# Patient Record
Sex: Male | Born: 1955 | Race: Black or African American | Hispanic: No | Marital: Married | Smoking: Former smoker
Health system: Southern US, Community
[De-identification: ages and names within clinical notes are randomized; demographics above are authoritative.]

## PROBLEM LIST (undated history)

## (undated) DIAGNOSIS — F1010 Alcohol abuse, uncomplicated: Secondary | ICD-10-CM

## (undated) DIAGNOSIS — E119 Type 2 diabetes mellitus without complications: Secondary | ICD-10-CM

## (undated) DIAGNOSIS — M109 Gout, unspecified: Secondary | ICD-10-CM

## (undated) DIAGNOSIS — Z87891 Personal history of nicotine dependence: Secondary | ICD-10-CM

## (undated) DIAGNOSIS — I10 Essential (primary) hypertension: Secondary | ICD-10-CM

## (undated) DIAGNOSIS — E785 Hyperlipidemia, unspecified: Secondary | ICD-10-CM

## (undated) HISTORY — PX: CHOLECYSTECTOMY: SHX55

## (undated) HISTORY — PX: HERNIA REPAIR: SHX51

## (undated) HISTORY — PX: BACK SURGERY: SHX140

---

## 2011-03-10 DIAGNOSIS — I10 Essential (primary) hypertension: Secondary | ICD-10-CM | POA: Diagnosis present

## 2011-03-10 DIAGNOSIS — M109 Gout, unspecified: Secondary | ICD-10-CM | POA: Diagnosis present

## 2012-10-12 DIAGNOSIS — E785 Hyperlipidemia, unspecified: Secondary | ICD-10-CM | POA: Insufficient documentation

## 2018-09-16 DIAGNOSIS — B182 Chronic viral hepatitis C: Secondary | ICD-10-CM | POA: Insufficient documentation

## 2019-09-06 ENCOUNTER — Encounter: Payer: Self-pay | Admitting: Emergency Medicine

## 2019-09-06 ENCOUNTER — Emergency Department
Admission: EM | Admit: 2019-09-06 | Discharge: 2019-09-06 | Disposition: A | Discharge status: Home / Self Care | Payer: 59 | Attending: Emergency Medicine | Admitting: Emergency Medicine

## 2019-09-06 ENCOUNTER — Other Ambulatory Visit: Payer: Self-pay

## 2019-09-06 DIAGNOSIS — M109 Gout, unspecified: Secondary | ICD-10-CM | POA: Insufficient documentation

## 2019-09-06 DIAGNOSIS — Z87891 Personal history of nicotine dependence: Secondary | ICD-10-CM | POA: Diagnosis not present

## 2019-09-06 DIAGNOSIS — E119 Type 2 diabetes mellitus without complications: Secondary | ICD-10-CM | POA: Diagnosis not present

## 2019-09-06 DIAGNOSIS — M79675 Pain in left toe(s): Secondary | ICD-10-CM | POA: Diagnosis present

## 2019-09-06 HISTORY — DX: Type 2 diabetes mellitus without complications: E11.9

## 2019-09-06 HISTORY — DX: Gout, unspecified: M10.9

## 2019-09-06 LAB — URIC ACID: Uric Acid, Serum: 6.8 mg/dL (ref 3.7–8.6)

## 2019-09-06 MED ORDER — COLCHICINE 0.6 MG PO TABS: 1.2000 mg | ORAL_TABLET | Freq: Once | ORAL | 0 refills | Status: DC

## 2019-09-06 MED ORDER — HYDROCODONE-ACETAMINOPHEN 5-325 MG PO TABS: 1.0000 | ORAL_TABLET | Freq: Four times a day (QID) | ORAL | 0 refills | Status: AC | PRN

## 2019-09-06 NOTE — ED Provider Notes (Signed)
[facility] Emergency Department Provider Note ____________________________________________  Time seen: Approximately 5:57 PM  I have reviewed the triage vital signs and the nursing notes.   HISTORY  Chief Complaint Gout    HPI James Mcmillan. is a 64 y.o. male who presents to the emergency department for evaluation and treatment of left great toe pain with redness and swelling that started 3 days ago.  He has a history of gout and feels that this is the cause of his pain.  He has taken Indocin without relief.  He denies injury.  He does state that he has some new tennis shoes and has been working out but denies injury.   Past Medical History:  Diagnosis Date  . Diabetes mellitus without complication ([place])   . Gout     There are no problems to display for this patient.   Past Surgical History:  Procedure Laterality Date  . BACK SURGERY    . CHOLECYSTECTOMY    . HERNIA REPAIR      Prior to Admission medications   Medication Sig Start Date End Date Taking? Authorizing Provider  colchicine 0.6 MG tablet Take 2 tablets (1.2 mg total) by mouth once for 1 dose. If pain persists after 1 hour, take 1 additional pill. No more than 3 pills in 24 hours. 09/06/19 09/06/19  Kendre Sires, Johnette Abraham B, FNP  HYDROcodone-acetaminophen (NORCO/VICODIN) 5-325 MG tablet Take 1 tablet by mouth every 6 (six) hours as needed for up to 3 days for severe pain. 09/06/19 09/09/19  Victorino Dike, FNP    Allergies Patient has no known allergies.  No family history on file.  Social History Social History   Tobacco Use  . Smoking status: Former Research scientist (life sciences)  . Smokeless tobacco: Never Used  Substance Use Topics  . Alcohol use: Yes  . Drug use: Never    Review of Systems Constitutional: Negative for fever. Cardiovascular: Negative for chest pain. Respiratory: Negative for shortness of breath. Musculoskeletal: Positive for left great toe pain. Skin: Positive for erythema and swelling to  the joint of the left great toe. Neurological: Negative for decrease in sensation  ____________________________________________   PHYSICAL EXAM:  VITAL SIGNS: ED Triage Vitals  Enc Vitals Group     BP 09/06/19 1643 (!) 145/97     Pulse Rate 09/06/19 1643 86     Resp 09/06/19 1643 16     Temp 09/06/19 1643 98.3 F (36.8 C)     Temp Source 09/06/19 1643 Oral     SpO2 09/06/19 1643 99 %     Weight 09/06/19 1643 204 lb (92.5 kg)     Height 09/06/19 1643 5\' 11"  (1.803 m)     Head Circumference --      Peak Flow --      Pain Score 09/06/19 1642 8     Pain Loc --      Pain Edu? --      Excl. in [place]? --     Constitutional: Alert and oriented. Well appearing and in no acute distress. Eyes: Conjunctivae are clear without discharge or drainage Head: Atraumatic Neck: Supple. Respiratory: No cough. Respirations are even and unlabored. Musculoskeletal: Focal pain, erythema, and swelling over the MTP of the left great toe. Neurologic: Motor and sensory function is intact Skin: Warm and erythematous over the MTP of the left great toe Psychiatric: Affect and behavior are appropriate.  ____________________________________________   LABS (all labs ordered are listed, but only abnormal results are displayed)  Labs Reviewed  URIC ACID   ____________________________________________  RADIOLOGY  No results found. ____________________________________________   PROCEDURES  Procedures  ____________________________________________   INITIAL IMPRESSION / ASSESSMENT AND PLAN / ED COURSE  James Mcmillan. is a 64 y.o. who presents to the emergency department for treatment and evaluation of left great toe pain.  See HPI for further details.  Symptoms and exam is consistent with gout flare.  He will be treated with colchicine and given few tablets of Norco.  He was advised not to drive if taking the Norco.  Patient instructed to follow-up with podiatry if not improving.  He was also  instructed to return to the emergency department for symptoms that change or worsen if unable schedule an appointment with podiatry or primary care.  Medications - No data to display  Pertinent labs & imaging results that were available during my care of the patient were reviewed by me and considered in my medical decision making (see chart for details).   _________________________________________   FINAL CLINICAL IMPRESSION(S) / ED DIAGNOSES  Final diagnoses:  Acute gout involving toe of left foot, unspecified cause    ED Discharge Orders         Ordered    colchicine 0.6 MG tablet   Once     09/06/19 1749    HYDROcodone-acetaminophen (NORCO/VICODIN) 5-325 MG tablet  Every 6 hours PRN     09/06/19 1749           If controlled substance prescribed during this visit, 12 month history viewed on the [facility] prior to issuing an initial prescription for Schedule II or III opiod.   Chinita Pester, FNP 09/06/19 1800    Phineas Semen, MD 09/06/19 2028

## 2019-09-06 NOTE — ED Triage Notes (Signed)
Pt in via POV, reports swelling/pain to left great toe since Sunday.  Pt reports hx of gout.  NAD noted at this time.

## 2019-09-06 NOTE — Discharge Instructions (Signed)
Do not take the Norco if you are on the road.  Follow up with podiatry for symptoms that are not improving with medication and rest.

## 2019-09-30 ENCOUNTER — Emergency Department
Admission: EM | Admit: 2019-09-30 | Discharge: 2019-09-30 | Disposition: A | Discharge status: Home / Self Care | Payer: 59 | Attending: Emergency Medicine | Admitting: Emergency Medicine

## 2019-09-30 ENCOUNTER — Emergency Department: Payer: 59

## 2019-09-30 ENCOUNTER — Encounter: Payer: Self-pay | Admitting: Emergency Medicine

## 2019-09-30 ENCOUNTER — Other Ambulatory Visit: Payer: Self-pay

## 2019-09-30 DIAGNOSIS — E119 Type 2 diabetes mellitus without complications: Secondary | ICD-10-CM | POA: Insufficient documentation

## 2019-09-30 DIAGNOSIS — M10072 Idiopathic gout, left ankle and foot: Secondary | ICD-10-CM | POA: Insufficient documentation

## 2019-09-30 DIAGNOSIS — Z87891 Personal history of nicotine dependence: Secondary | ICD-10-CM | POA: Insufficient documentation

## 2019-09-30 DIAGNOSIS — M8580 Other specified disorders of bone density and structure, unspecified site: Secondary | ICD-10-CM

## 2019-09-30 DIAGNOSIS — M85872 Other specified disorders of bone density and structure, left ankle and foot: Secondary | ICD-10-CM | POA: Insufficient documentation

## 2019-09-30 DIAGNOSIS — M1000 Idiopathic gout, unspecified site: Secondary | ICD-10-CM

## 2019-09-30 DIAGNOSIS — M79672 Pain in left foot: Secondary | ICD-10-CM | POA: Diagnosis present

## 2019-09-30 LAB — CBC WITH DIFFERENTIAL/PLATELET
Abs Immature Granulocytes: 0.01 10*3/uL (ref 0.00–0.07)
Basophils Absolute: 0 10*3/uL (ref 0.0–0.1)
Basophils Relative: 0 %
Eosinophils Absolute: 0.5 10*3/uL (ref 0.0–0.5)
Eosinophils Relative: 7 %
HCT: 43 % (ref 39.0–52.0)
Hemoglobin: 14.1 g/dL (ref 13.0–17.0)
Immature Granulocytes: 0 %
Lymphocytes Relative: 27 %
Lymphs Abs: 2 10*3/uL (ref 0.7–4.0)
MCH: 29.4 pg (ref 26.0–34.0)
MCHC: 32.8 g/dL (ref 30.0–36.0)
MCV: 89.6 fL (ref 80.0–100.0)
Monocytes Absolute: 0.8 10*3/uL (ref 0.1–1.0)
Monocytes Relative: 11 %
Neutro Abs: 4.1 10*3/uL (ref 1.7–7.7)
Neutrophils Relative %: 55 %
Platelets: 231 10*3/uL (ref 150–400)
RBC: 4.8 MIL/uL (ref 4.22–5.81)
RDW: 11.7 % (ref 11.5–15.5)
WBC: 7.4 10*3/uL (ref 4.0–10.5)
nRBC: 0 % (ref 0.0–0.2)

## 2019-09-30 LAB — COMPREHENSIVE METABOLIC PANEL
ALT: 24 U/L (ref 0–44)
AST: 27 U/L (ref 15–41)
Albumin: 4.1 g/dL (ref 3.5–5.0)
Alkaline Phosphatase: 55 U/L (ref 38–126)
Anion gap: 7 (ref 5–15)
BUN: 12 mg/dL (ref 8–23)
CO2: 27 mmol/L (ref 22–32)
Calcium: 9.8 mg/dL (ref 8.9–10.3)
Chloride: 106 mmol/L (ref 98–111)
Creatinine, Ser: 1.02 mg/dL (ref 0.61–1.24)
GFR calc Af Amer: >60 mL/min (ref >60)
GFR calc non Af Amer: >60 mL/min (ref >60)
Glucose, Bld: 134 mg/dL — ABNORMAL HIGH (ref 70–99)
Potassium: 5.2 mmol/L — ABNORMAL HIGH (ref 3.5–5.1)
Sodium: 140 mmol/L (ref 135–145)
Total Bilirubin: 0.7 mg/dL (ref 0.3–1.2)
Total Protein: 7.6 g/dL (ref 6.5–8.1)

## 2019-09-30 LAB — URIC ACID: Uric Acid, Serum: 6.2 mg/dL (ref 3.7–8.6)

## 2019-09-30 IMAGING — DX DG FOOT COMPLETE 3+V*L*
3 series · 3 of 3 positions shown · non-contrast
Comparison: None.

CLINICAL DATA: Pain and swelling in the left foot. History of gout.
No reported injury.

EXAM:
LEFT FOOT - COMPLETE 3+ VIEW

[foot ap]
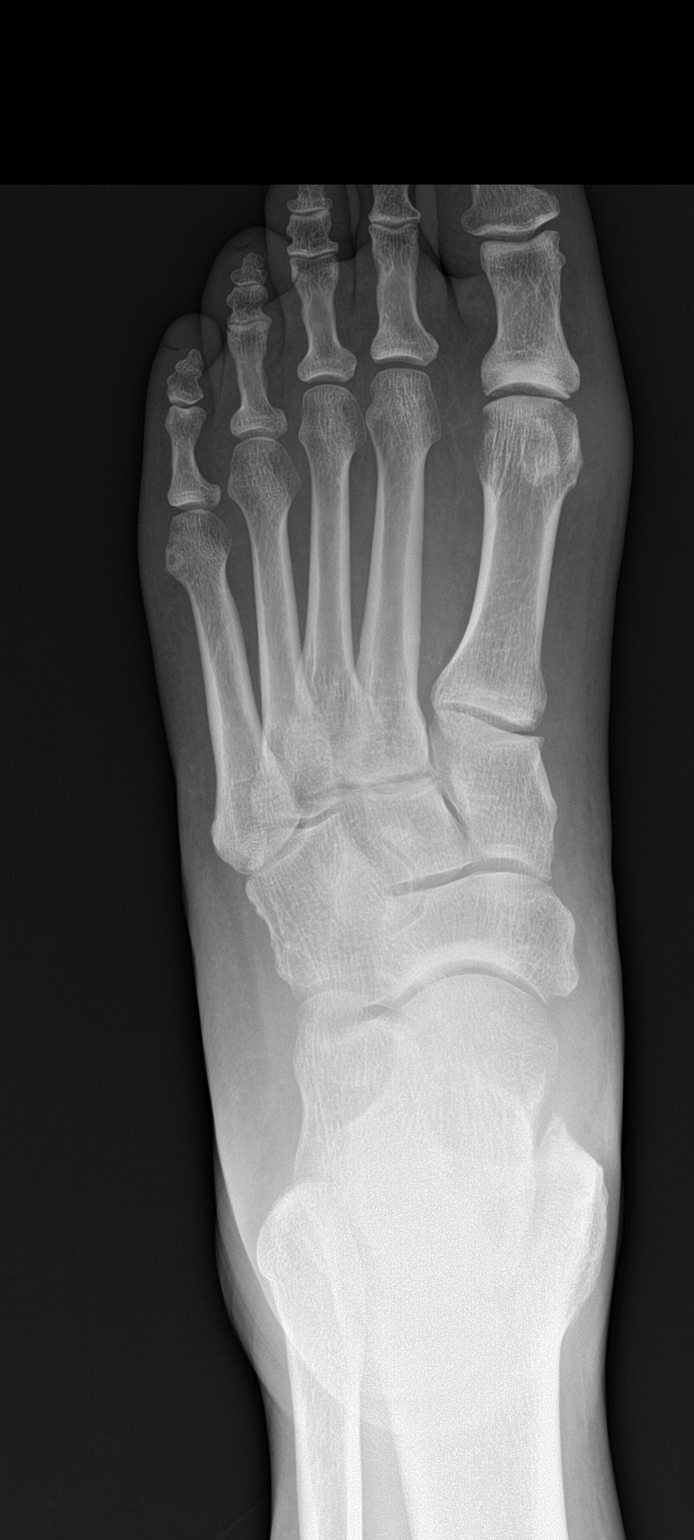

[foot obl]
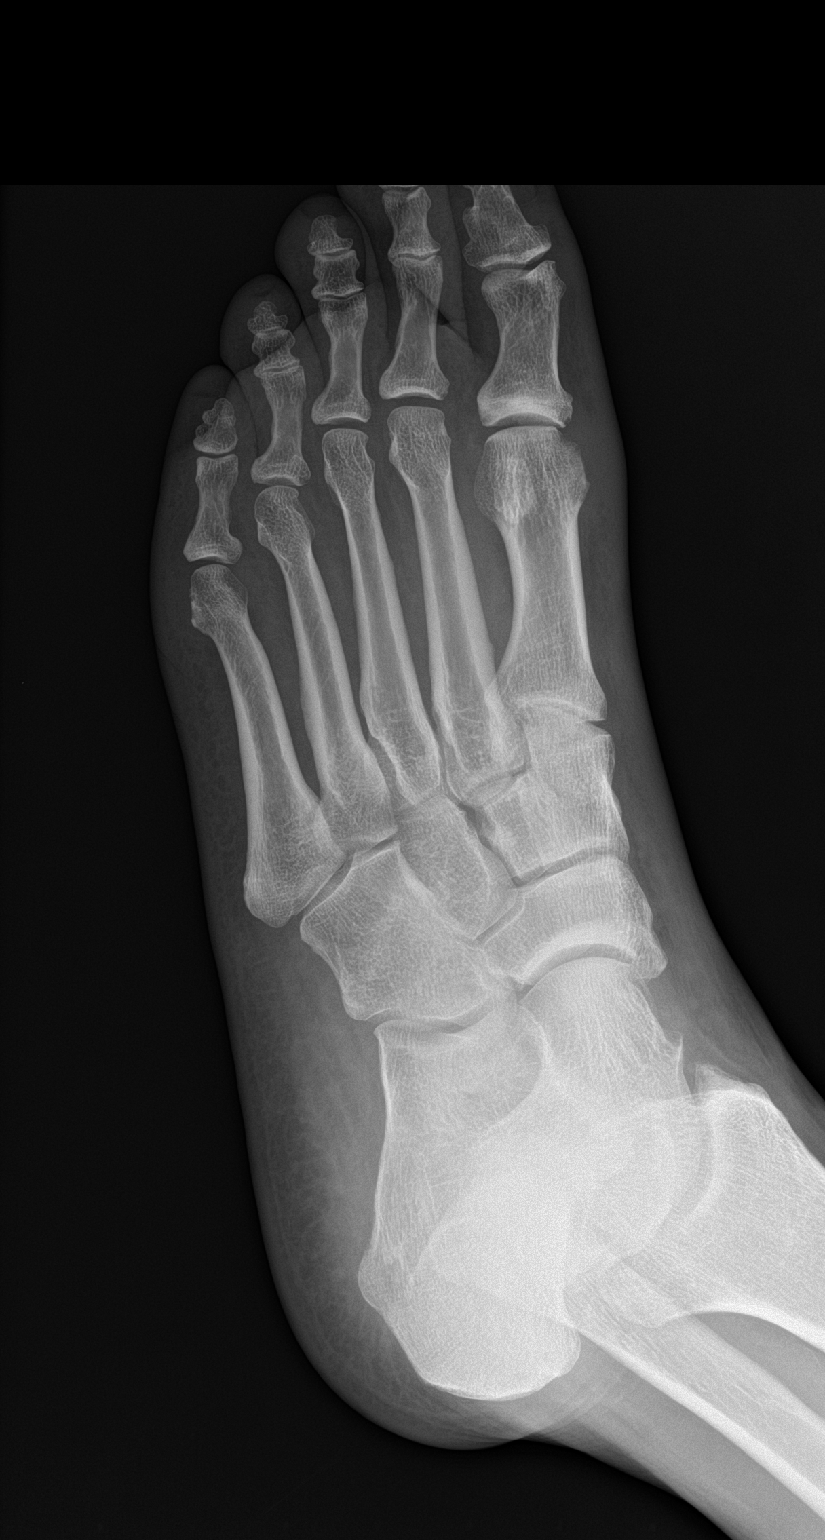

[foot lat]
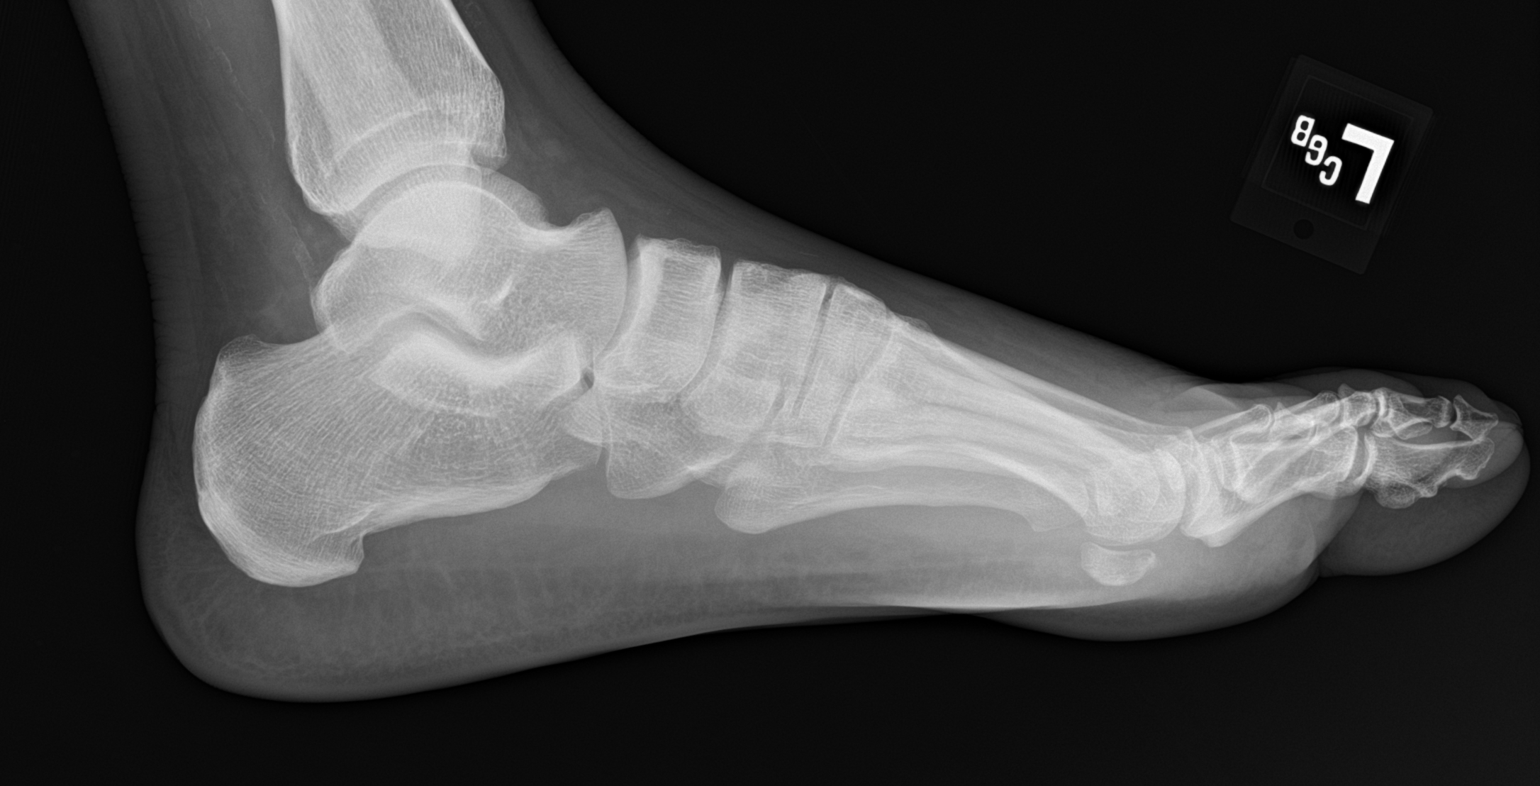

[3 of 3 positions shown; findings below may reference images not displayed]

FINDINGS: No fracture or dislocation. No tophaceous soft tissue
calcifications. Soft tissue swelling medial to the first MTP joint.
Tiny juxta-articular erosion at the proximal phalangeal side of the
left first MTP joint. No additional erosions proved no focal osseous
lesions. No radiopaque foreign bodies.
IMPRESSION: Tiny juxta-articular erosion at the proximal phalangeal side of the
left first MTP joint with adjacent soft tissue swelling, compatible
with reported history of gout. No tophaceous soft tissue
calcifications.

## 2019-09-30 MED ORDER — COLCHICINE 0.6 MG PO TABS: 0.6000 mg | ORAL_TABLET | Freq: Every day | ORAL | 2 refills | Status: DC

## 2019-09-30 MED ORDER — PREDNISONE 10 MG (21) PO TBPK: ORAL_TABLET | ORAL | 0 refills | Status: DC

## 2019-09-30 MED ORDER — ALLOPURINOL 100 MG PO TABS: 100.0000 mg | ORAL_TABLET | Freq: Every day | ORAL | 2 refills | Status: DC

## 2019-09-30 MED ORDER — OXYCODONE-ACETAMINOPHEN 5-325 MG PO TABS: 1.0000 | ORAL_TABLET | ORAL | 0 refills | Status: AC | PRN

## 2019-09-30 MED ORDER — OXYCODONE-ACETAMINOPHEN 5-325 MG PO TABS
1.0000 | ORAL_TABLET | Freq: Once | ORAL | Status: AC
  Administered 2019-09-30: 1 via ORAL
  Filled 2019-09-30: qty 1

## 2019-09-30 NOTE — ED Triage Notes (Signed)
Pt arrived via POV with reports of left foot pain and swelling, pt has hx of the same pain a few weeks ago dx with gout.

## 2019-09-30 NOTE — ED Notes (Signed)
Pt states having left lower foot pain and swelling.

## 2019-09-30 NOTE — ED Provider Notes (Signed)
[facility] Emergency Department Provider Note  ____________________________________________   First MD Initiated Contact with Patient 09/30/19 1412     (approximate)  I have reviewed the triage vital signs and the nursing notes.   HISTORY  Chief Complaint Foot Pain    HPI James Wilkie. is a 64 y.o. male presents emergency department complaint of left foot pain and swelling.  History of same a few weeks ago was diagnosed with gout.  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  States that the foot became grossly swollen where he can get his shoe on earlier in the week.  States it has fallen down with elevation.  Is denying any calf pain or leg pain.  States it hurts right at the  left great toe .  Patient states he does not drink alcohol during the week but drinks a sixpack throughout the weekend.  Patient is also on medications such as a statin, Metformin, and blood pressure medication.   Past Medical History:  Diagnosis Date  . Diabetes mellitus without complication ([place])   . Gout     There are no problems to display for this patient.   Past Surgical History:  Procedure Laterality Date  . BACK SURGERY    . CHOLECYSTECTOMY    . HERNIA REPAIR      Prior to Admission medications   Medication Sig Start Date End Date Taking? Authorizing Provider  allopurinol (ZYLOPRIM) 100 MG tablet Take 1 tablet (100 mg total) by mouth daily. 09/30/19 09/29/20  Caryn Section Linden Dolin, PA-C  colchicine 0.6 MG tablet Take 1 tablet (0.6 mg total) by mouth daily. 09/30/19 09/29/20  Isaah Furry, Linden Dolin, PA-C  oxyCODONE-acetaminophen (PERCOCET) 5-325 MG tablet Take 1 tablet by mouth every 4 (four) hours as needed for severe pain. 09/30/19 09/29/20  Gentri Guardado, Linden Dolin, PA-C  predniSONE (STERAPRED UNI-PAK 21 TAB) 10 MG (21) TBPK tablet Take 6 pills on day one then decrease by 1 pill each day 09/30/19   Versie Starks, PA-C    Allergies Patient has no known allergies.  History  reviewed. No pertinent family history.  Social History Social History   Tobacco Use  . Smoking status: Former Research scientist (life sciences)  . Smokeless tobacco: Never Used  Substance Use Topics  . Alcohol use: Yes  . Drug use: Never    Review of Systems  Constitutional: No fever/chills Eyes: No visual changes. ENT: No sore throat. Respiratory: Denies cough Cardiovascular: Denies chest pain Gastrointestinal: Denies abdominal pain Genitourinary: Negative for dysuria. Musculoskeletal: Negative for back pain.  Positive for left foot pain Skin: Negative for rash. Psychiatric: no mood changes,     ____________________________________________   PHYSICAL EXAM:  VITAL SIGNS: ED Triage Vitals  Enc Vitals Group     BP 09/30/19 1357 135/88     Pulse Rate 09/30/19 1357 80     Resp 09/30/19 1357 18     Temp 09/30/19 1357 98.7 F (37.1 C)     Temp Source 09/30/19 1357 Oral     SpO2 09/30/19 1357 100 %     Weight 09/30/19 1407 204 lb (92.5 kg)     Height 09/30/19 1407 5\' 11"  (1.803 m)     Head Circumference --      Peak Flow --      Pain Score 09/30/19 1407 10     Pain Loc --      Pain Edu? --      Excl. in [place]? --  Constitutional: Alert and oriented. Well appearing and in no acute distress. Eyes: Conjunctivae are normal.  Head: Atraumatic. Nose: No congestion/rhinnorhea. Mouth/Throat: Mucous membranes are moist.   Neck:  supple no lymphadenopathy noted Cardiovascular: Normal rate, regular rhythm.  Respiratory: Normal respiratory effort.  No retractions GU: deferred Musculoskeletal: FROM all extremities, warm and well perfused, left foot is tender swollen and red at the left great toe Neurologic:  Normal speech and language.  Skin:  Skin is warm, dry and intact. No rash noted. Psychiatric: Mood and affect are normal. Speech and behavior are normal.  ____________________________________________   LABS (all labs ordered are listed, but only abnormal results are displayed)  Labs  Reviewed  COMPREHENSIVE METABOLIC PANEL - Abnormal; Notable for the following components:      Result Value   Potassium 5.2 (*)    Glucose, Bld 134 (*)    All other components within normal limits  CBC WITH DIFFERENTIAL/PLATELET  URIC ACID   ____________________________________________   ____________________________________________  RADIOLOGY  X-ray of the left foot shows erosion at the left great proximal phalanx due to gout  ____________________________________________   PROCEDURES  Procedure(s) performed: No  Procedures    ____________________________________________   INITIAL IMPRESSION / ASSESSMENT AND PLAN / ED COURSE  Pertinent labs & imaging results that were available during my care of the patient were reviewed by me and considered in my medical decision making (see chart for details).   Patient 64 year old male presents emergency department with left foot pain.  Patient had a gout flare few weeks ago and has continued to have pain.  Took his medications as prescribed.  Physical exam does show redness and swelling typical of gout at the left foot.  However due to the patient's medications in concerns for liver damage due to the statin/Metformin, and alcohol use a feel that blood work would be appropriate at this time.  CBC, metabolic panel, uric acid ordered, x-ray left foot   X-ray of the left foot does show erosion due to gout.  The patient CBC, metabolic panel, and uric acid were normal.    I explained all these findings to the patient.  Explained to him that gout is eroding his joint.  He is to follow-up with the foot specialist.  He should also discuss uric acid control with his regular physician.  I did give him a prescription for coca seen start taking daily, allopurinol, Sterapred, and Percocet for pain as needed.  We had a long discussion about low purine diet.  I told him at this time his lifestyle is much more important to stop the erosion been  medication.  He states he understands.  To follow-up with the physicians as instructed.  He was discharged stable condition.  James Calico. was evaluated in Emergency Department on 09/30/2019 for the symptoms described in the history of present illness. He was evaluated in the context of the global COVID-19 pandemic, which necessitated consideration that the patient might be at risk for infection with the SARS-CoV-2 virus that causes COVID-19. Institutional protocols and algorithms that pertain to the evaluation of patients at risk for COVID-19 are in a state of rapid change based on information released by regulatory bodies including the CDC and federal and state organizations. These policies and algorithms were followed during the patient's care in the ED.   As part of my medical decision making, I reviewed the following data within the electronic MEDICAL RECORD NUMBER History obtained from family, Nursing notes reviewed and incorporated,  Labs reviewed , Old chart reviewed, Radiograph reviewed , Notes from prior ED visits and [place] Controlled Substance Database  ____________________________________________   FINAL CLINICAL IMPRESSION(S) / ED DIAGNOSES  Final diagnoses:  Erosion of bone  Acute idiopathic gout, unspecified site      NEW MEDICATIONS STARTED DURING THIS VISIT:  Discharge Medication List as of 09/30/2019  3:28 PM    START taking these medications   Details  allopurinol (ZYLOPRIM) 100 MG tablet Take 1 tablet (100 mg total) by mouth daily., Starting Sat 09/30/2019, Until Sun 09/29/2020, Normal    oxyCODONE-acetaminophen (PERCOCET) 5-325 MG tablet Take 1 tablet by mouth every 4 (four) hours as needed for severe pain., Starting Sat 09/30/2019, Until Sun 09/29/2020, Normal    predniSONE (STERAPRED UNI-PAK 21 TAB) 10 MG (21) TBPK tablet Take 6 pills on day one then decrease by 1 pill each day, Normal         Note:  This document was prepared using Dragon voice recognition software and  may include unintentional dictation errors.    Faythe Ghee, PA-C 09/30/19 1554    Chesley Noon, MD 10/01/19 [phone]

## 2019-09-30 NOTE — Discharge Instructions (Addendum)
Follow-up with podiatry.  Also follow-up with your regular doctor to learn how to manage gout easier. Return emergency department worsening Take the medications as prescribed

## 2019-09-30 NOTE — ED Notes (Signed)
First Nurse Note: Pt c/o left foot pain. Pt is in NAD.

## 2020-11-12 DIAGNOSIS — E119 Type 2 diabetes mellitus without complications: Secondary | ICD-10-CM

## 2021-07-25 ENCOUNTER — Observation Stay: Payer: BC Managed Care – PPO

## 2021-07-25 ENCOUNTER — Observation Stay
Admission: EM | Admit: 2021-07-25 | Discharge: 2021-07-26 | Disposition: A | Discharge status: Home / Self Care | Payer: BC Managed Care – PPO | Attending: Internal Medicine | Admitting: Internal Medicine

## 2021-07-25 ENCOUNTER — Encounter: Payer: Self-pay | Admitting: Internal Medicine

## 2021-07-25 ENCOUNTER — Emergency Department: Payer: BC Managed Care – PPO

## 2021-07-25 DIAGNOSIS — Z20822 Contact with and (suspected) exposure to covid-19: Secondary | ICD-10-CM | POA: Insufficient documentation

## 2021-07-25 DIAGNOSIS — Z79899 Other long term (current) drug therapy: Secondary | ICD-10-CM | POA: Diagnosis not present

## 2021-07-25 DIAGNOSIS — M109 Gout, unspecified: Secondary | ICD-10-CM | POA: Diagnosis present

## 2021-07-25 DIAGNOSIS — F1010 Alcohol abuse, uncomplicated: Secondary | ICD-10-CM

## 2021-07-25 DIAGNOSIS — R4781 Slurred speech: Principal | ICD-10-CM | POA: Insufficient documentation

## 2021-07-25 DIAGNOSIS — Z87891 Personal history of nicotine dependence: Secondary | ICD-10-CM | POA: Insufficient documentation

## 2021-07-25 DIAGNOSIS — E119 Type 2 diabetes mellitus without complications: Secondary | ICD-10-CM | POA: Diagnosis not present

## 2021-07-25 DIAGNOSIS — R479 Unspecified speech disturbances: Secondary | ICD-10-CM

## 2021-07-25 DIAGNOSIS — E785 Hyperlipidemia, unspecified: Secondary | ICD-10-CM | POA: Insufficient documentation

## 2021-07-25 DIAGNOSIS — I10 Essential (primary) hypertension: Secondary | ICD-10-CM | POA: Insufficient documentation

## 2021-07-25 HISTORY — DX: Hyperlipidemia, unspecified: E78.5

## 2021-07-25 HISTORY — DX: Alcohol abuse, uncomplicated: F10.10

## 2021-07-25 HISTORY — DX: Personal history of nicotine dependence: Z87.891

## 2021-07-25 HISTORY — DX: Essential (primary) hypertension: I10

## 2021-07-25 LAB — DIFFERENTIAL
Abs Immature Granulocytes: 0.01 10*3/uL (ref 0.00–0.07)
Basophils Absolute: 0 10*3/uL (ref 0.0–0.1)
Basophils Relative: 0 %
Eosinophils Absolute: 0.2 10*3/uL (ref 0.0–0.5)
Eosinophils Relative: 3 %
Immature Granulocytes: 0 %
Lymphocytes Relative: 38 %
Lymphs Abs: 2.2 10*3/uL (ref 0.7–4.0)
Monocytes Absolute: 0.5 10*3/uL (ref 0.1–1.0)
Monocytes Relative: 10 %
Neutro Abs: 2.8 10*3/uL (ref 1.7–7.7)
Neutrophils Relative %: 49 %

## 2021-07-25 LAB — COMPREHENSIVE METABOLIC PANEL
ALT: 18 U/L (ref 0–44)
AST: 24 U/L (ref 15–41)
Albumin: 4 g/dL (ref 3.5–5.0)
Alkaline Phosphatase: 58 U/L (ref 38–126)
Anion gap: 9 (ref 5–15)
BUN: 17 mg/dL (ref 8–23)
CO2: 24 mmol/L (ref 22–32)
Calcium: 9.4 mg/dL (ref 8.9–10.3)
Chloride: 102 mmol/L (ref 98–111)
Creatinine, Ser: 1.12 mg/dL (ref 0.61–1.24)
GFR, Estimated: >60 mL/min (ref >60)
Glucose, Bld: 306 mg/dL — ABNORMAL HIGH (ref 70–99)
Potassium: 4.1 mmol/L (ref 3.5–5.1)
Sodium: 135 mmol/L (ref 135–145)
Total Bilirubin: 0.9 mg/dL (ref 0.3–1.2)
Total Protein: 7.2 g/dL (ref 6.5–8.1)

## 2021-07-25 LAB — URINE DRUG SCREEN, QUALITATIVE (ARMC ONLY)
Amphetamines, Ur Screen: NOT DETECTED
Barbiturates, Ur Screen: NOT DETECTED
Benzodiazepine, Ur Scrn: NOT DETECTED
Cannabinoid 50 Ng, Ur ~~LOC~~: NOT DETECTED
Cocaine Metabolite,Ur ~~LOC~~: NOT DETECTED
MDMA (Ecstasy)Ur Screen: NOT DETECTED
Methadone Scn, Ur: NOT DETECTED
Opiate, Ur Screen: NOT DETECTED
Phencyclidine (PCP) Ur S: NOT DETECTED
Tricyclic, Ur Screen: NOT DETECTED

## 2021-07-25 LAB — URINALYSIS, ROUTINE W REFLEX MICROSCOPIC
Bacteria, UA: NONE SEEN
Bilirubin Urine: NEGATIVE
Glucose, UA: >=500 mg/dL — AB
Hgb urine dipstick: NEGATIVE
Ketones, ur: 20 mg/dL — AB
Leukocytes,Ua: NEGATIVE
Nitrite: NEGATIVE
Protein, ur: NEGATIVE mg/dL
Specific Gravity, Urine: 1.025 (ref 1.005–1.030)
WBC, UA: NONE SEEN WBC/hpf (ref 0–5)
pH: 5 (ref 5.0–8.0)

## 2021-07-25 LAB — CREATININE, SERUM
Creatinine, Ser: 1.08 mg/dL (ref 0.61–1.24)
GFR, Estimated: >60 mL/min (ref >60)

## 2021-07-25 LAB — LIPID PANEL
Cholesterol: 297 mg/dL — ABNORMAL HIGH (ref 0–200)
HDL: 38 mg/dL — ABNORMAL LOW (ref >40)
LDL Cholesterol: UNDETERMINED mg/dL (ref 0–99)
Total CHOL/HDL Ratio: 7.8 RATIO
Triglycerides: 584 mg/dL — ABNORMAL HIGH (ref <150)
VLDL: UNDETERMINED mg/dL (ref 0–40)

## 2021-07-25 LAB — CBC
HCT: 42.9 % (ref 39.0–52.0)
Hemoglobin: 14.5 g/dL (ref 13.0–17.0)
MCH: 29.6 pg (ref 26.0–34.0)
MCHC: 33.8 g/dL (ref 30.0–36.0)
MCV: 87.6 fL (ref 80.0–100.0)
Platelets: 221 10*3/uL (ref 150–400)
RBC: 4.9 MIL/uL (ref 4.22–5.81)
RDW: 11.8 % (ref 11.5–15.5)
WBC: 5.7 10*3/uL (ref 4.0–10.5)
nRBC: 0 % (ref 0.0–0.2)

## 2021-07-25 LAB — PROTIME-INR
INR: 0.9 (ref 0.8–1.2)
Prothrombin Time: 12.3 seconds (ref 11.4–15.2)

## 2021-07-25 LAB — T4, FREE: Free T4: 0.96 ng/dL (ref 0.61–1.12)

## 2021-07-25 LAB — RESP PANEL BY RT-PCR (FLU A&B, COVID) ARPGX2
Influenza A by PCR: NEGATIVE
Influenza B by PCR: NEGATIVE
SARS Coronavirus 2 by RT PCR: NEGATIVE

## 2021-07-25 LAB — CBG MONITORING, ED: Glucose-Capillary: 322 mg/dL — ABNORMAL HIGH (ref 70–99)

## 2021-07-25 LAB — TSH: TSH: 3.11 u[IU]/mL (ref 0.350–4.500)

## 2021-07-25 LAB — APTT: aPTT: 29 seconds (ref 24–36)

## 2021-07-25 LAB — ETHANOL: Alcohol, Ethyl (B): <10 mg/dL (ref <10)

## 2021-07-25 LAB — AMMONIA: Ammonia: 17 umol/L (ref 9–35)

## 2021-07-25 IMAGING — MR MR MRA NECK WO/W CM
4 of 5 series · 35 of 48 positions shown · IV contrast (9ml Gadavist)
Comparison: Prior head CT from earlier the same day.

CLINICAL DATA: Initial evaluation for acute TIA.

EXAM:
MRI HEAD WITHOUT CONTRAST
MRA HEAD WITHOUT CONTRAST
MRA NECK WITHOUT AND WITH CONTRAST
TECHNIQUE: Multiplanar, multi-echo pulse sequences of the brain and surrounding
structures were acquired without intravenous contrast. Angiographic
images of the Circle of Willis were acquired using MRA technique
without intravenous contrast. Angiographic images of the neck were
acquired using MRA technique without and with intravenous contrast.
Carotid stenosis measurements (when applicable) are obtained
utilizing NASCET criteria, using the distal internal carotid
diameter as the denominator.
CONTRAST:  9mL GADAVIST GADOBUTROL 1 MMOL/ML IV SOLN

[Series 9: angio_fl3d_cor_pre_ttc=2.0s · coronal · 0.9mm · 0.85mm/px · 9 of 96 slices shown]
[im 1/96]
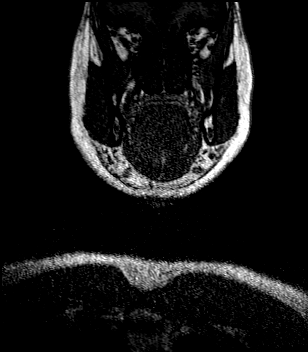
[im 12/96]
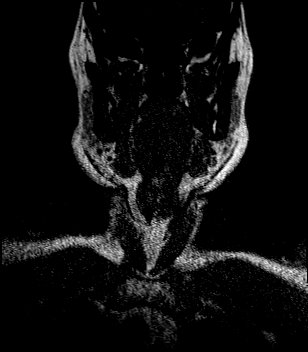
[im 24/96]
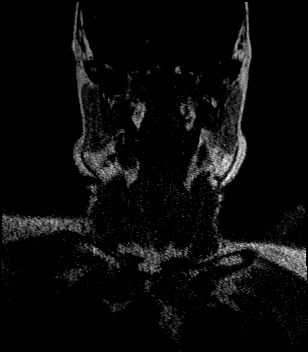
[im 36/96]
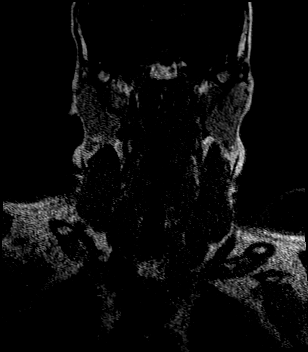
[im 48/96]
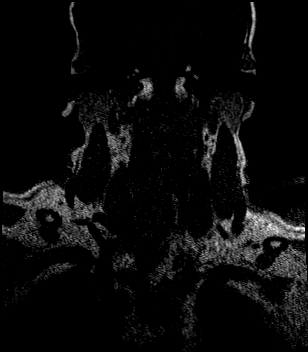
[im 60/96]
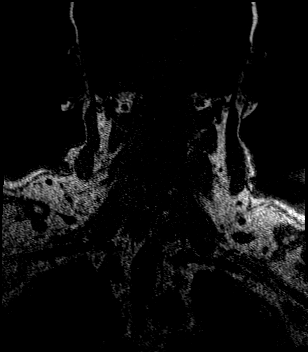
[im 72/96]
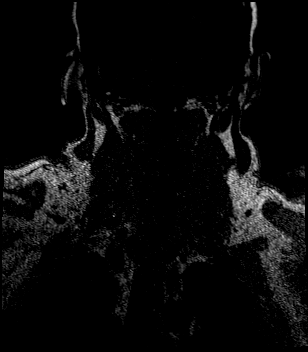
[im 84/96]
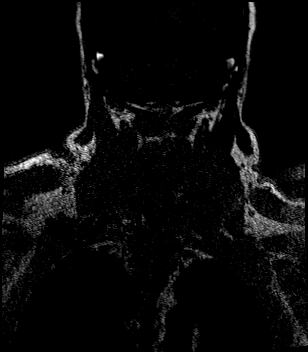
[im 96/96]
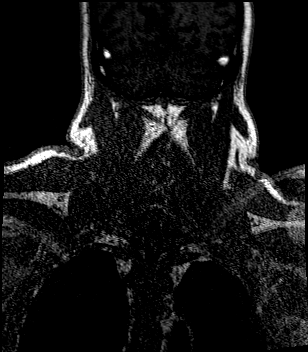

[Series 11: angio_fl3d_cor_post_ttc=2.0s · coronal · 0.9mm · 0.85mm/px · 9 of 95 slices shown]
[im 1/95]
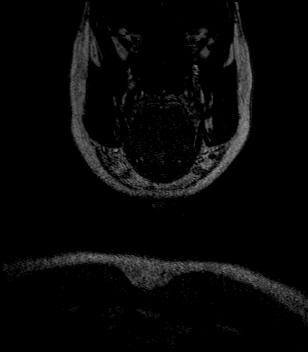
[im 12/95]
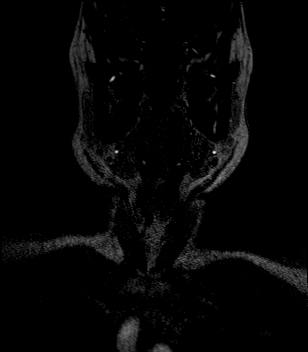
[im 24/95]
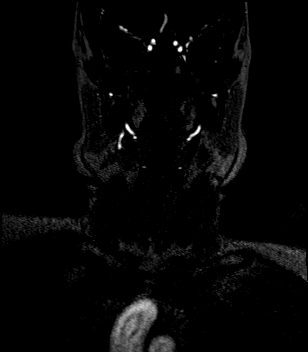
[im 36/95]
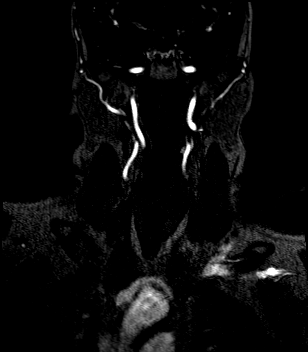
[im 48/95]
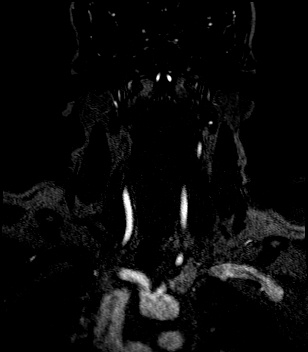
[im 59/95]
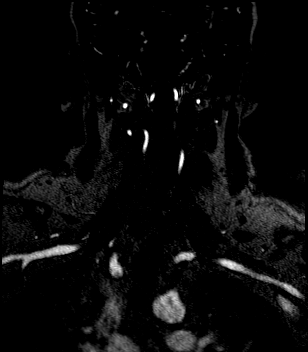
[im 71/95]
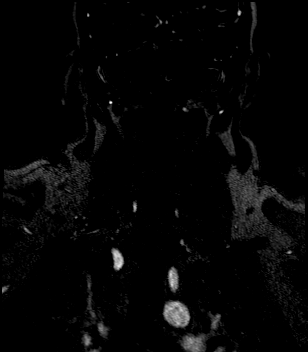
[im 83/95]
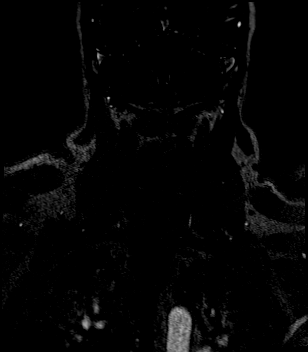
[im 95/95]
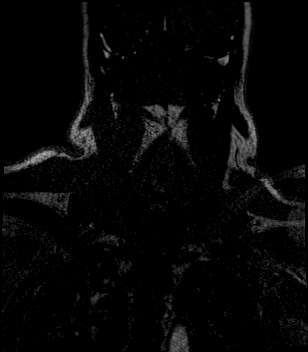

[Series 12: angio_fl3d_cor_post_ttc=2.0s_moco-adv · coronal · 0.9mm · 0.85mm/px · 9 of 95 slices shown]
[im 1/95]
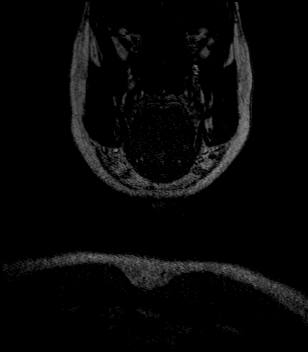
[im 12/95]
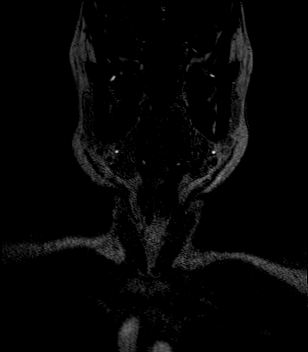
[im 24/95]
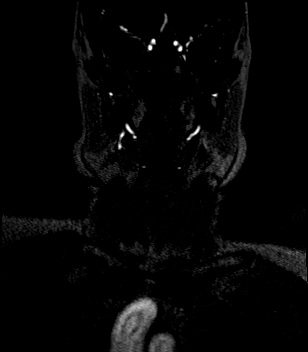
[im 36/95]
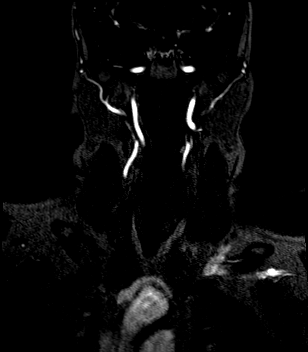
[im 48/95]
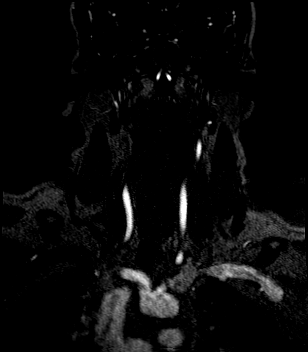
[im 59/95]
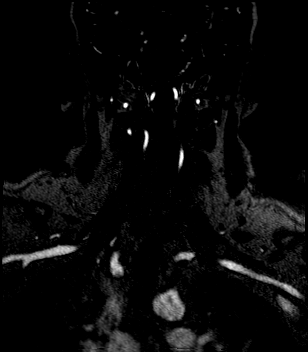
[im 71/95]
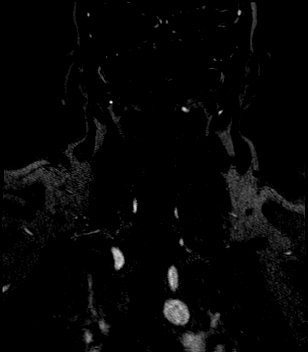
[im 83/95]
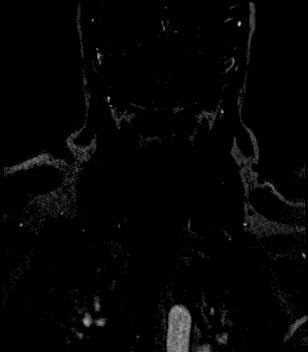
[im 95/95]
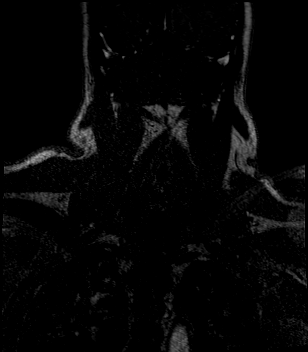

[Series 13: angio_fl3d_cor_post_ttc=2.0s_moco-adv_sub · coronal · 0.9mm · 0.85mm/px · 8 of 90 slices shown]
[im 1/90]
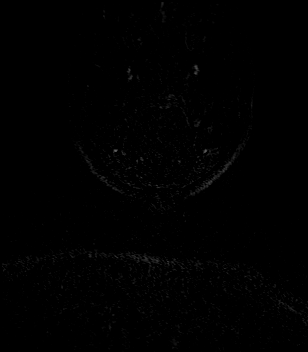
[im 13/90]
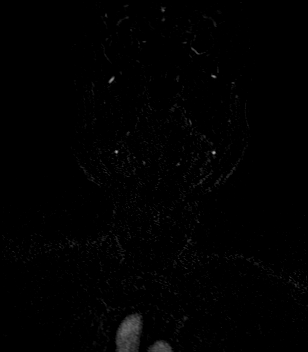
[im 26/90]
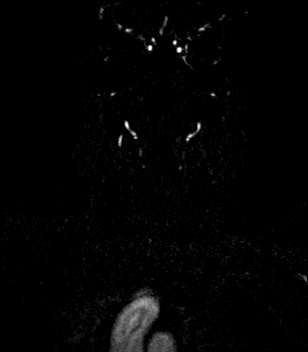
[im 39/90]
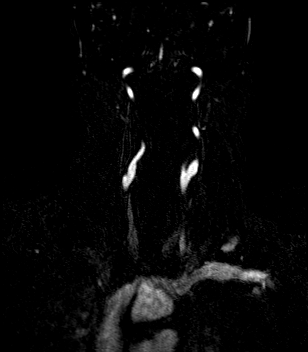
[im 51/90]
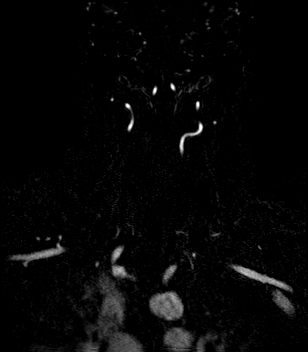
[im 64/90]
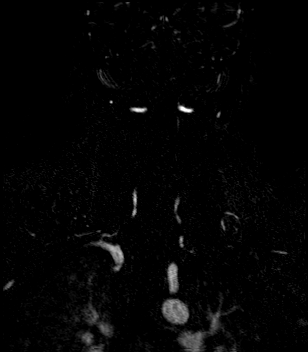
[im 77/90]
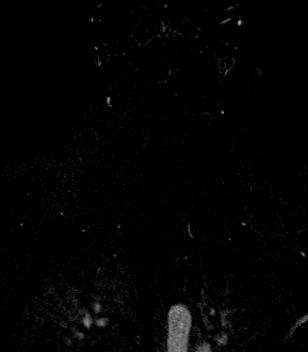
[im 90/90]
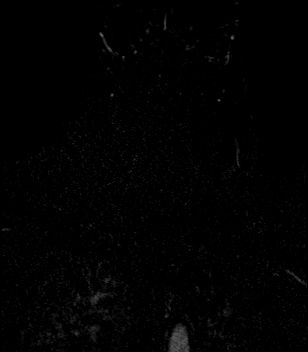

[35 of 48 positions shown; findings below may reference images not displayed]

FINDINGS: MRI HEAD FINDINGS

Brain: Volume cerebral volume within normal limits. Patchy T2/FLAIR
hyperintensity involving the periventricular and deep white matter
both cerebral hemispheres as well as the pons, most consistent with
chronic small vessel ischemic disease, mild in nature.

Subtle patchy diffusion signal abnormality seen involving the right
ventral pons (series 13, image 16). Associated T2/FLAIR signal
intensity without ADC correlate at this location. Associated T1
hypointensity (series 24, image 54). Finding felt to be most
consistent with an evolving late subacute infarct. Area of
involvement measures approximately 1.9 cm in length. No associated
hemorrhage or mass effect.

No other diffusion abnormality to suggest acute or subacute
ischemia. Gray-white matter differentiation otherwise maintained. No
other areas of chronic cortical infarction. No other acute or
chronic blood products.

No mass lesion, midline shift or mass effect no hydrocephalus or
extra-axial fluid collection. Pituitary gland suprasellar region
normal. Midline structures intact.

Vascular: Major intracranial vascular flow voids are maintained.

Skull and upper cervical spine: Craniocervical junction within
normal limits. Bone marrow signal intensity grossly within normal
limits. No focal marrow replacing lesion. No scalp soft tissue
abnormality.

Sinuses/Orbits: Globes orbital soft tissues within normal limits.
Paranasal sinuses are clear. No mastoid effusion. Inner ear
structures grossly normal.

Other: None.

MRA HEAD FINDINGS

Anterior circulation: Visualized distal cervical segments of the
internal carotid arteries are patent with antegrade flow. Petrous,
cavernous, and supraclinoid segments patent without stenosis. A1
segments patent bilaterally. Normal anterior communicating artery
complex. Both ACAs widely patent proximally. There are suspected
severe stenoses involving the distal A2/A3 segments bilaterally.
Distal ACAs are severely attenuated as resolved, although remain
grossly patent to their distal aspects on 3D time-of-flight
sequence.

Normal in stenosis or occlusion. Normal MCA bifurcations. Distal MCA
branches well perfused and symmetric. Diffuse small vessel
atheromatous irregularity noted about the MCA branches bilaterally.

Posterior circulation: Both V4 segments patent to the
vertebrobasilar junction without stenosis. Neither PICA origin well
visualized. Basilar patent to its distal aspect without stenosis.
Right SCA widely patent. Probable severe stenosis at the origin of
the left SCA. Left ICA grossly patent distally on time-of-flight
sequence. Both PCAs patent at their origins. Severe multifocal
stenoses seen involving the P2 segments bilaterally, left worse than
right (series [CO], image 6). PCAs remain grossly patent to their
distal aspects.

Anatomic variants: None significant.  No visible aneurysm.

MRA NECK FINDINGS

Aortic arch: Visualized aortic arch normal caliber with normal 3
vessel morphology. No stenosis about the origin the great vessels.

Right carotid system: Right common and internal carotid arteries
patent without stenosis or evidence for dissection.

Left carotid system: Left common internal carotid arteries patent
without stenosis or evidence for dissection.

Vertebral arteries: Both vertebral arteries arise from the
subclavian arteries. No significant proximal subclavian artery
stenosis. Vertebral arteries patent without stenosis or evidence for
dissection.

Other: None
IMPRESSION: MRI HEAD IMPRESSION:

1. Subtle 1.9 cm focus of patchy signal abnormality involving the
right ventral pons as above, most consistent with an infarct, late
subacute in appearance. No associated hemorrhage or mass effect.
2. No other acute intracranial abnormality.
3. Underlying mild chronic microvascular ischemic disease.

MRA HEAD IMPRESSION:

1. Negative MRA for large vessel occlusion.
2. Intracranial atherosclerotic disease with associated severe
multifocal stenoses involving the bilateral distal A2/A3 segments,
with additional severe multifocal stenoses involving the bilateral
P2 segments, left worse than right. Distal ACAs and PCAs are
severely attenuated, but remain grossly patent.
3. Probable severe stenosis at the origin of the left SCA.
4. Diffuse small vessel atheromatous irregularity about the MCA
branches bilaterally.

MRA NECK IMPRESSION:

Negative MRA of the neck. No hemodynamically significant or
correctable stenosis.

## 2021-07-25 IMAGING — MR MR MRA HEAD W/O CM
1 series · 16 of 48 positions shown · IV contrast (gadavist)
Comparison: Prior head CT from earlier the same day.

CLINICAL DATA: Initial evaluation for acute TIA.

EXAM:
MRI HEAD WITHOUT CONTRAST
MRA HEAD WITHOUT CONTRAST
MRA NECK WITHOUT AND WITH CONTRAST
TECHNIQUE: Multiplanar, multi-echo pulse sequences of the brain and surrounding
structures were acquired without intravenous contrast. Angiographic
images of the Circle of Willis were acquired using MRA technique
without intravenous contrast. Angiographic images of the neck were
acquired using MRA technique without and with intravenous contrast.
Carotid stenosis measurements (when applicable) are obtained
utilizing NASCET criteria, using the distal internal carotid
diameter as the denominator.
CONTRAST:  9mL GADAVIST GADOBUTROL 1 MMOL/ML IV SOLN

[Series 1: TOF · axial · 0.5mm · 0.41mm/px · z∈[-123,-27]mm · 16 of 205 slices shown]
[im 1/205]
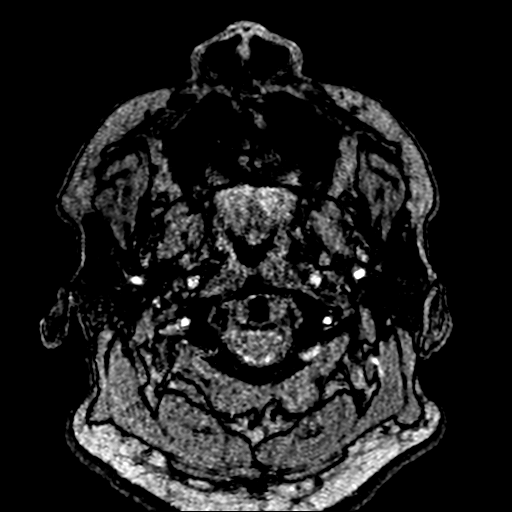
[im 5/205]
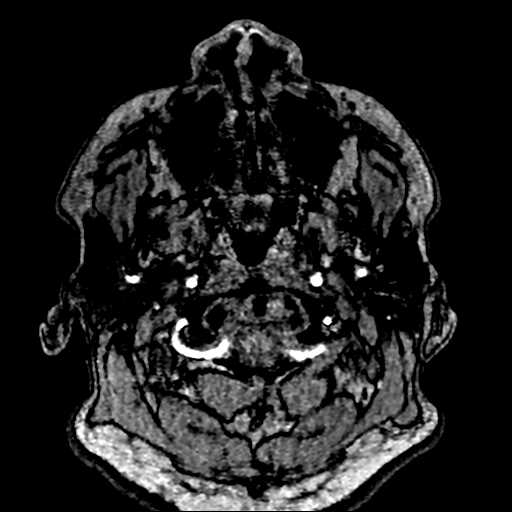
[im 9/205]
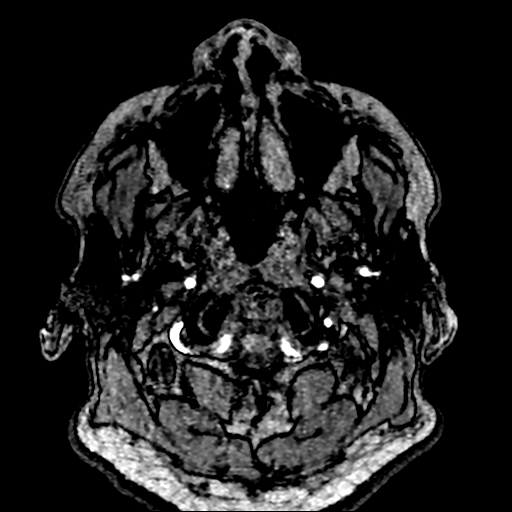
[im 14/205]
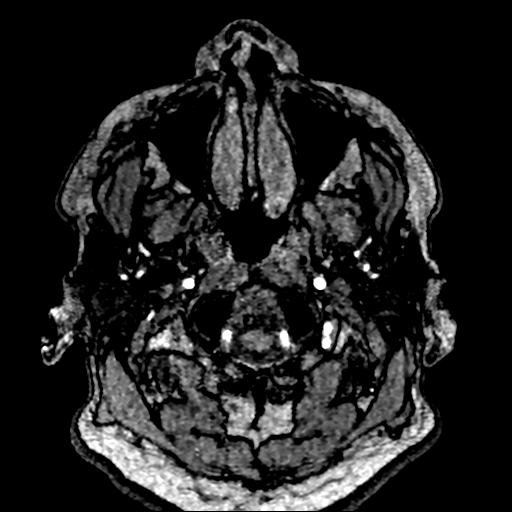
[im 18/205]
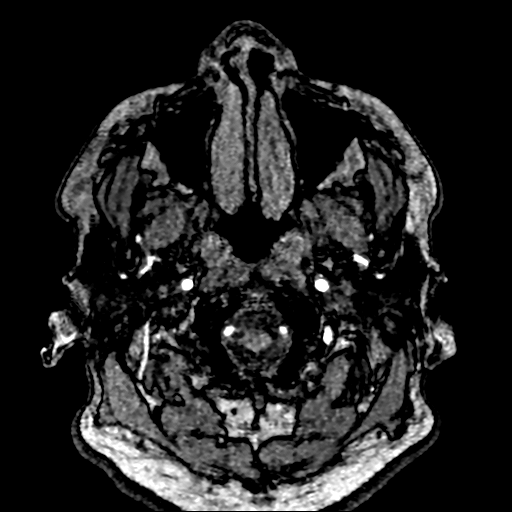
[im 22/205]
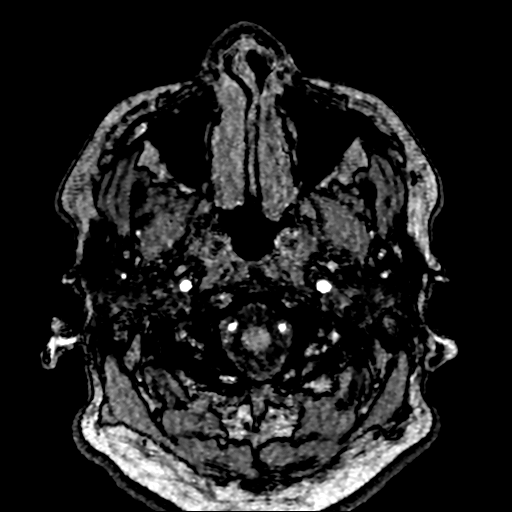
[im 35/205]
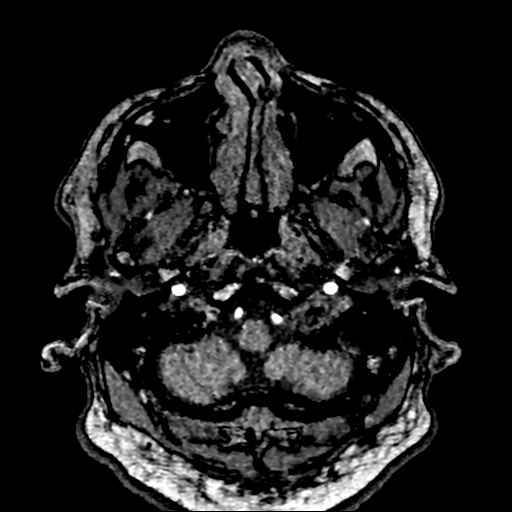
[im 40/205]
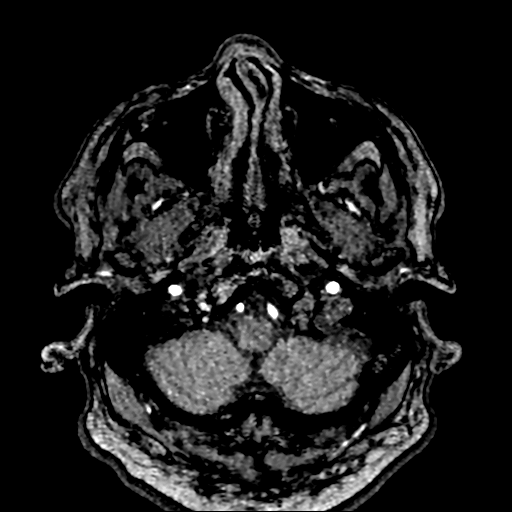
[im 66/205]
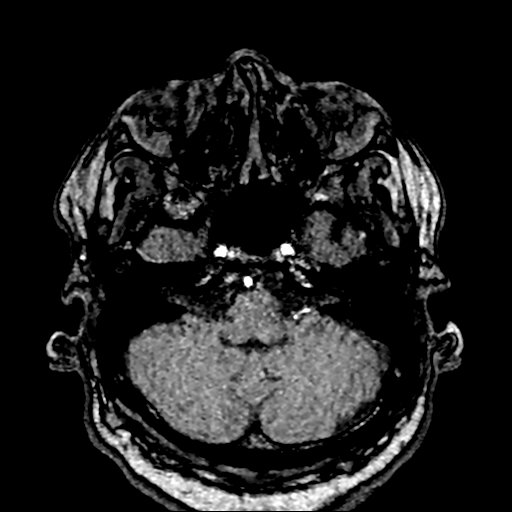
[im 92/205]
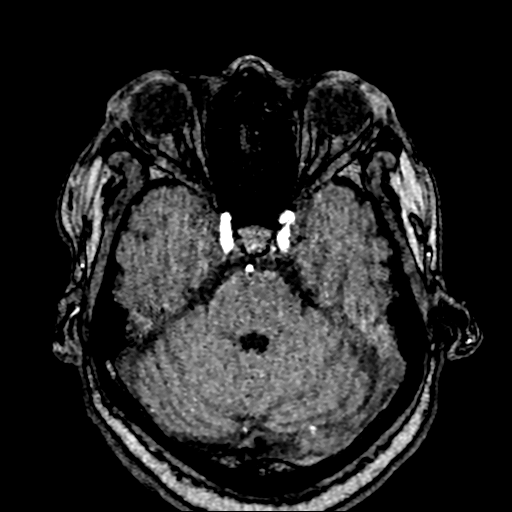
[im 105/205]
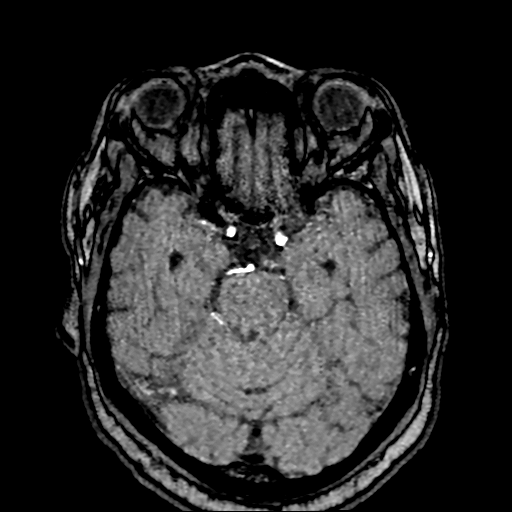
[im 118/205]
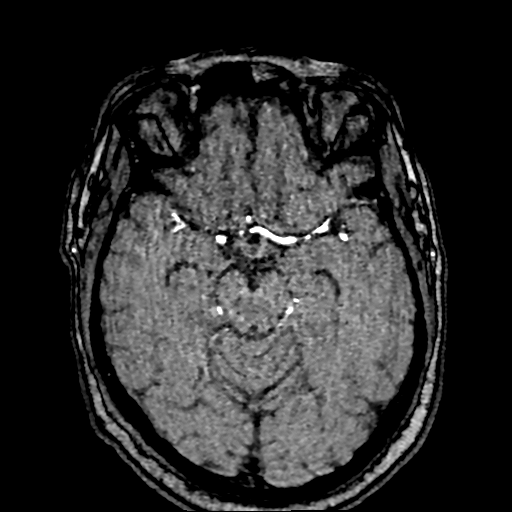
[im 144/205]
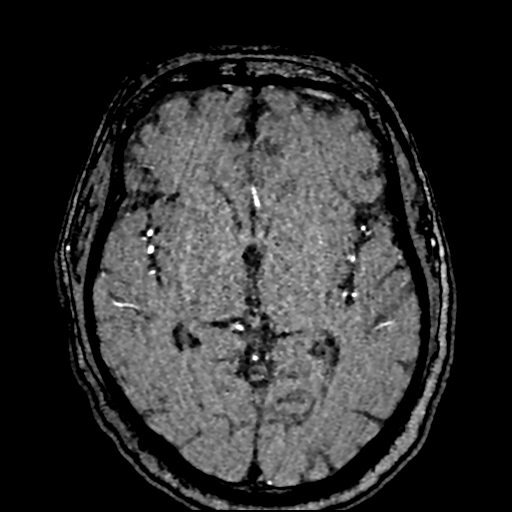
[im 170/205]
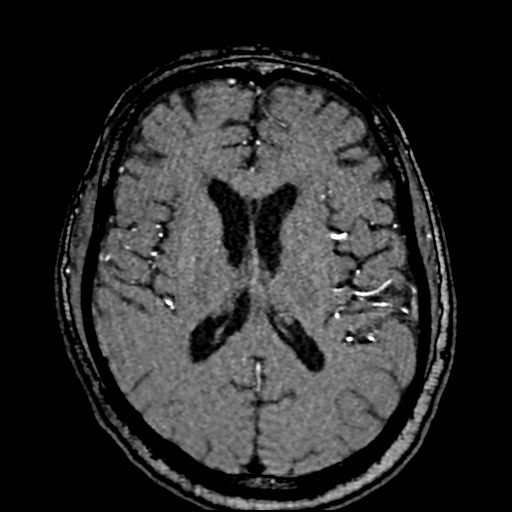
[im 174/205]
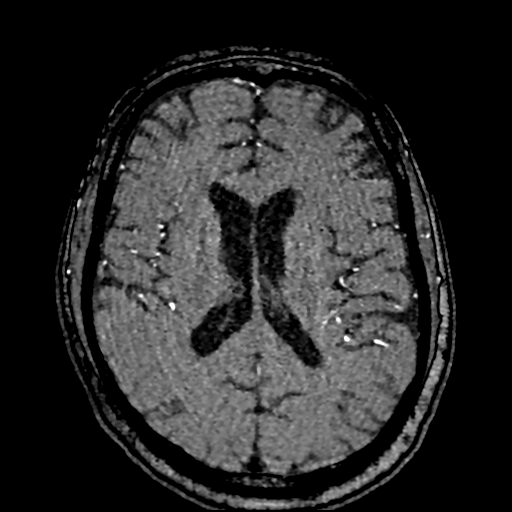
[im 196/205]
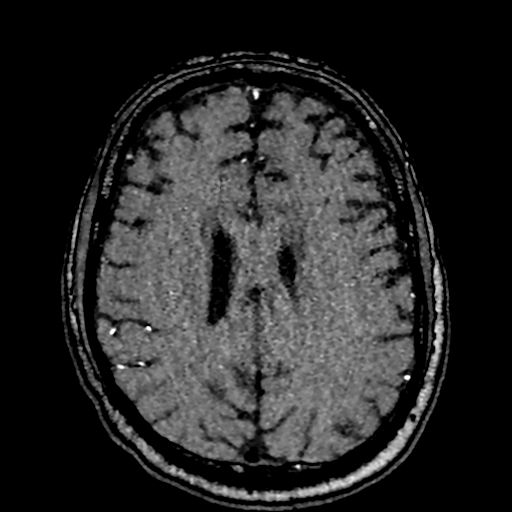

[16 of 48 positions shown; findings below may reference images not displayed]

FINDINGS: MRI HEAD FINDINGS

Brain: Volume cerebral volume within normal limits. Patchy T2/FLAIR
hyperintensity involving the periventricular and deep white matter
both cerebral hemispheres as well as the pons, most consistent with
chronic small vessel ischemic disease, mild in nature.

Subtle patchy diffusion signal abnormality seen involving the right
ventral pons (series 13, image 16). Associated T2/FLAIR signal
intensity without ADC correlate at this location. Associated T1
hypointensity (series 24, image 54). Finding felt to be most
consistent with an evolving late subacute infarct. Area of
involvement measures approximately 1.9 cm in length. No associated
hemorrhage or mass effect.

No other diffusion abnormality to suggest acute or subacute
ischemia. Gray-white matter differentiation otherwise maintained. No
other areas of chronic cortical infarction. No other acute or
chronic blood products.

No mass lesion, midline shift or mass effect no hydrocephalus or
extra-axial fluid collection. Pituitary gland suprasellar region
normal. Midline structures intact.

Vascular: Major intracranial vascular flow voids are maintained.

Skull and upper cervical spine: Craniocervical junction within
normal limits. Bone marrow signal intensity grossly within normal
limits. No focal marrow replacing lesion. No scalp soft tissue
abnormality.

Sinuses/Orbits: Globes orbital soft tissues within normal limits.
Paranasal sinuses are clear. No mastoid effusion. Inner ear
structures grossly normal.

Other: None.

MRA HEAD FINDINGS

Anterior circulation: Visualized distal cervical segments of the
internal carotid arteries are patent with antegrade flow. Petrous,
cavernous, and supraclinoid segments patent without stenosis. A1
segments patent bilaterally. Normal anterior communicating artery
complex. Both ACAs widely patent proximally. There are suspected
severe stenoses involving the distal A2/A3 segments bilaterally.
Distal ACAs are severely attenuated as resolved, although remain
grossly patent to their distal aspects on 3D time-of-flight
sequence.

Normal in stenosis or occlusion. Normal MCA bifurcations. Distal MCA
branches well perfused and symmetric. Diffuse small vessel
atheromatous irregularity noted about the MCA branches bilaterally.

Posterior circulation: Both V4 segments patent to the
vertebrobasilar junction without stenosis. Neither PICA origin well
visualized. Basilar patent to its distal aspect without stenosis.
Right SCA widely patent. Probable severe stenosis at the origin of
the left SCA. Left ICA grossly patent distally on time-of-flight
sequence. Both PCAs patent at their origins. Severe multifocal
stenoses seen involving the P2 segments bilaterally, left worse than
right (series [CO], image 6). PCAs remain grossly patent to their
distal aspects.

Anatomic variants: None significant.  No visible aneurysm.

MRA NECK FINDINGS

Aortic arch: Visualized aortic arch normal caliber with normal 3
vessel morphology. No stenosis about the origin the great vessels.

Right carotid system: Right common and internal carotid arteries
patent without stenosis or evidence for dissection.

Left carotid system: Left common internal carotid arteries patent
without stenosis or evidence for dissection.

Vertebral arteries: Both vertebral arteries arise from the
subclavian arteries. No significant proximal subclavian artery
stenosis. Vertebral arteries patent without stenosis or evidence for
dissection.

Other: None
IMPRESSION: MRI HEAD IMPRESSION:

1. Subtle 1.9 cm focus of patchy signal abnormality involving the
right ventral pons as above, most consistent with an infarct, late
subacute in appearance. No associated hemorrhage or mass effect.
2. No other acute intracranial abnormality.
3. Underlying mild chronic microvascular ischemic disease.

MRA HEAD IMPRESSION:

1. Negative MRA for large vessel occlusion.
2. Intracranial atherosclerotic disease with associated severe
multifocal stenoses involving the bilateral distal A2/A3 segments,
with additional severe multifocal stenoses involving the bilateral
P2 segments, left worse than right. Distal ACAs and PCAs are
severely attenuated, but remain grossly patent.
3. Probable severe stenosis at the origin of the left SCA.
4. Diffuse small vessel atheromatous irregularity about the MCA
branches bilaterally.

MRA NECK IMPRESSION:

Negative MRA of the neck. No hemodynamically significant or
correctable stenosis.

## 2021-07-25 IMAGING — CT CT HEAD CODE STROKE
4 series · 16 of 47 positions shown, 18 images · non-contrast
Comparison: None.

CLINICAL DATA: Code stroke. Slurred speech, slight left facial
droop



[Series 3: head wo · axial · 0.42mm/px · z∈[+284,+399]mm · 7 of 31 slices shown, 9 images]
[im 4/31  brain]
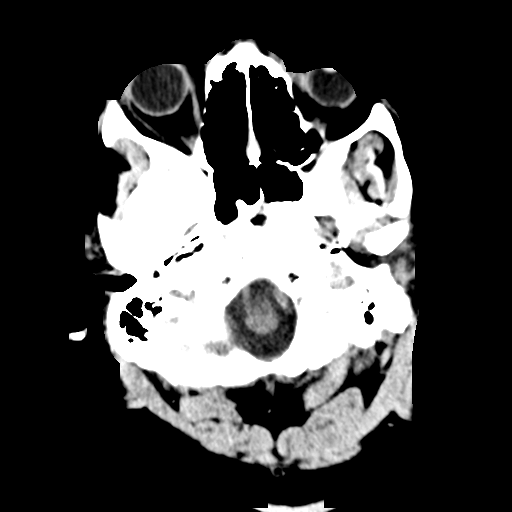
[im 4/31  bone]
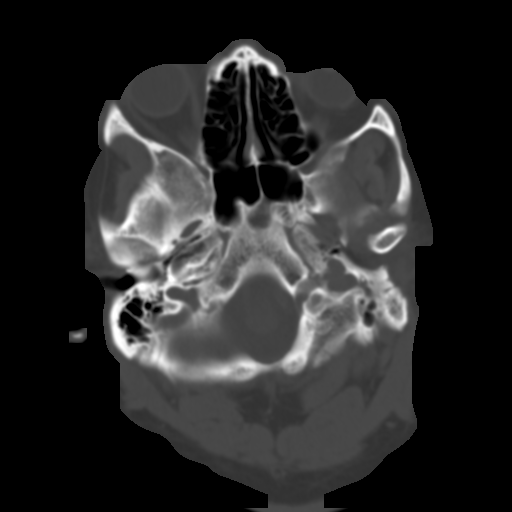
[im 8/31  brain]
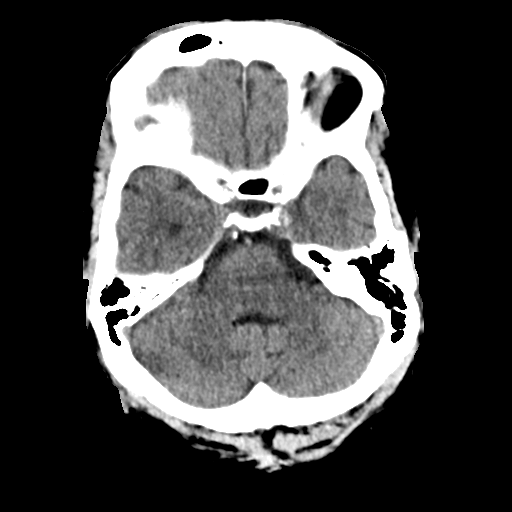
[im 12/31  brain]
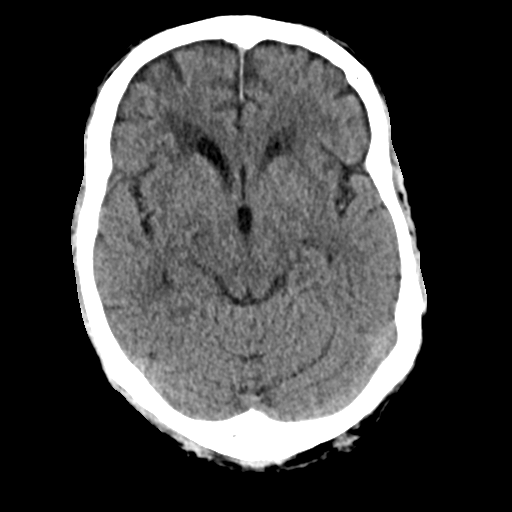
[im 16/31  brain]
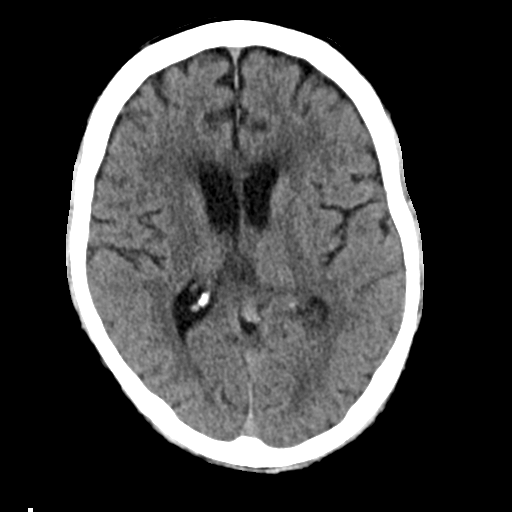
[im 19/31  brain]
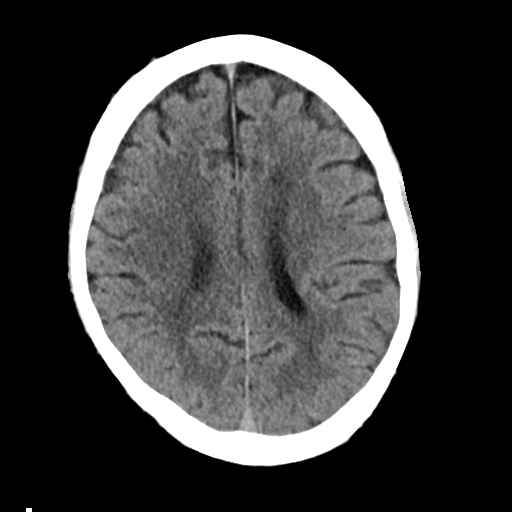
[im 19/31  bone]
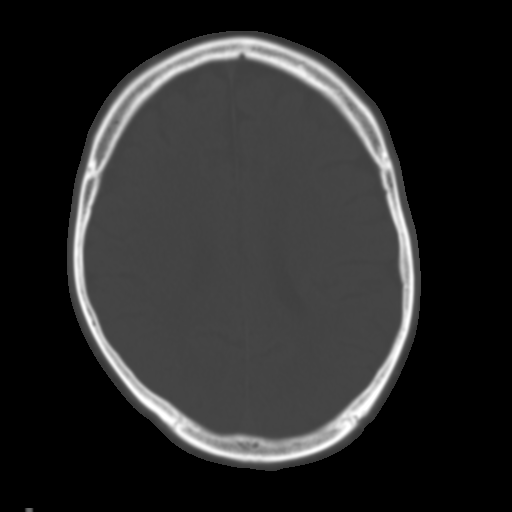
[im 23/31  brain]
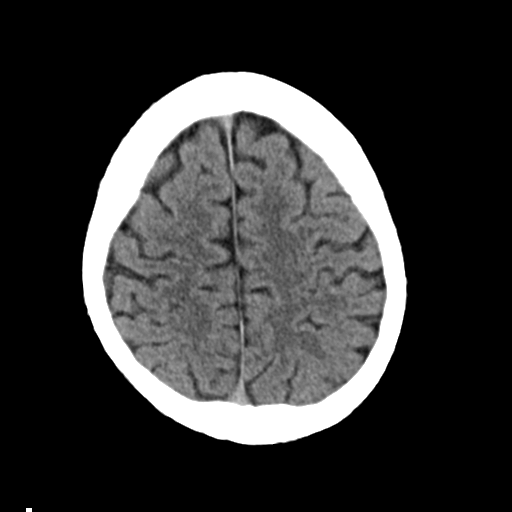
[im 27/31  brain]
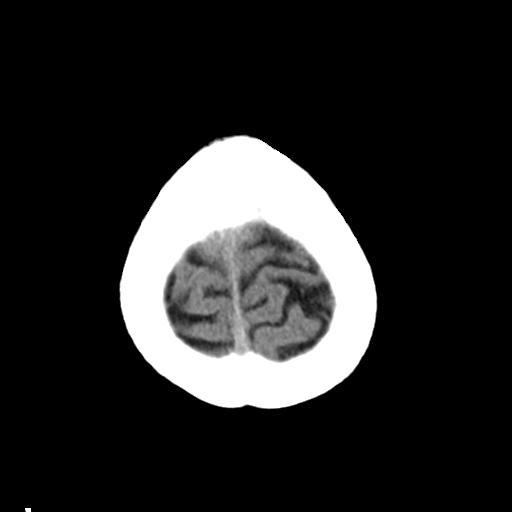

[Series 4: head bone · axial · 0.42mm/px · z∈[+283,+313]mm · 3 of 77 slices shown]
[im 8/77  bone]
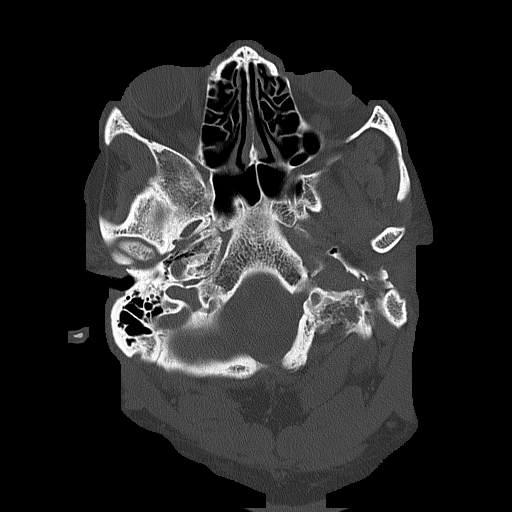
[im 16/77  bone]
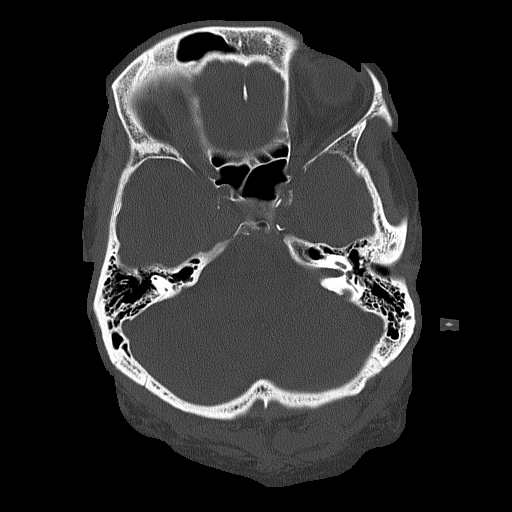
[im 23/77  bone]
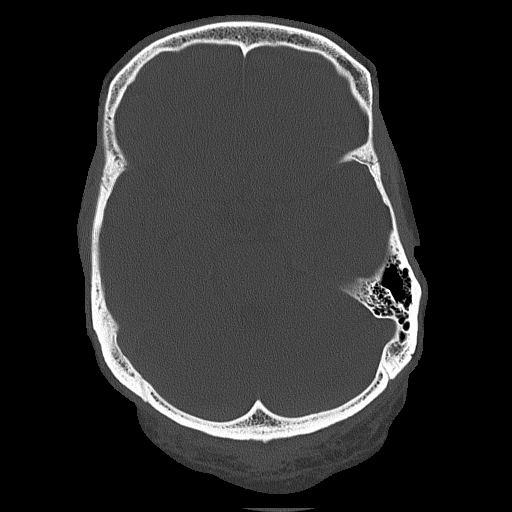

[Series 5: coronal soft tissue · coronal · 0.32mm/px · 3 of 68 slices shown]
[im 23/68  brain]
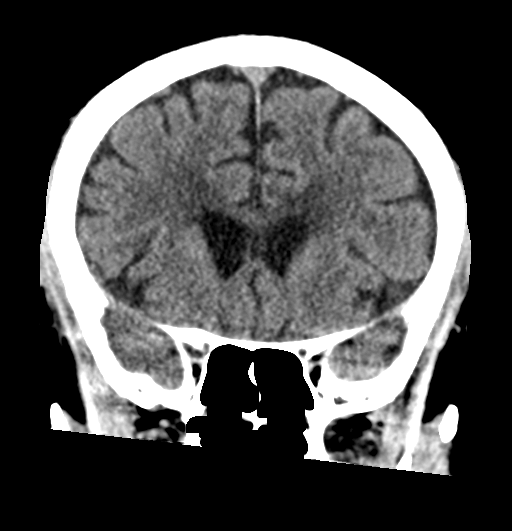
[im 30/68  brain]
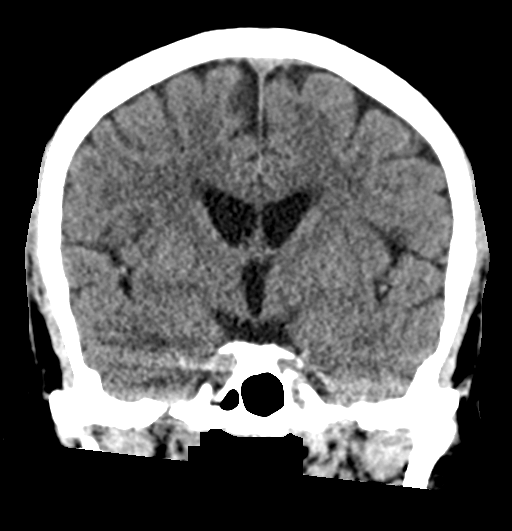
[im 38/68  brain]
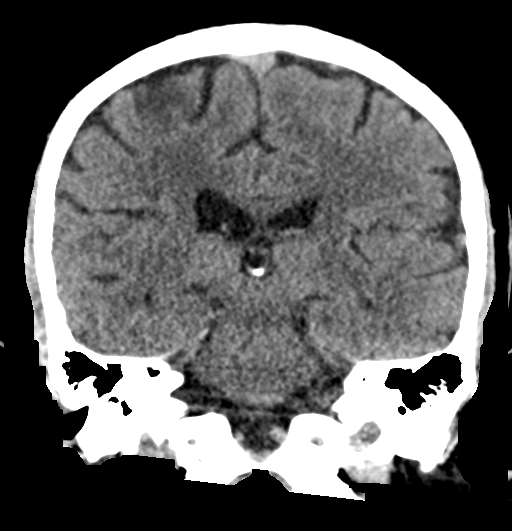

[Series 6: sagittal soft tissue · sagittal · 0.33mm/px · 3 of 55 slices shown]
[im 19/55  brain]
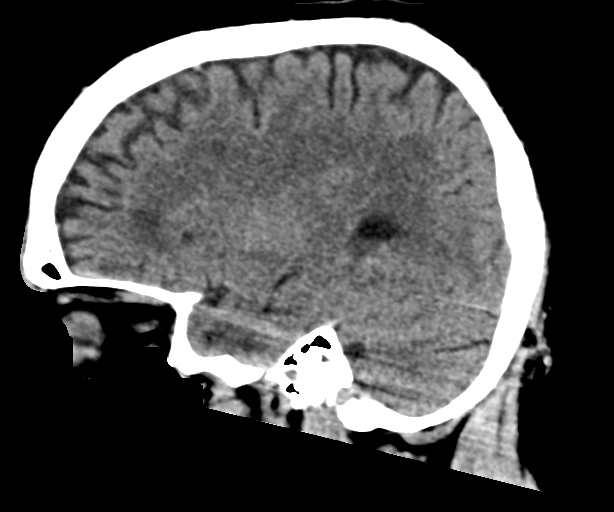
[im 28/55  brain]
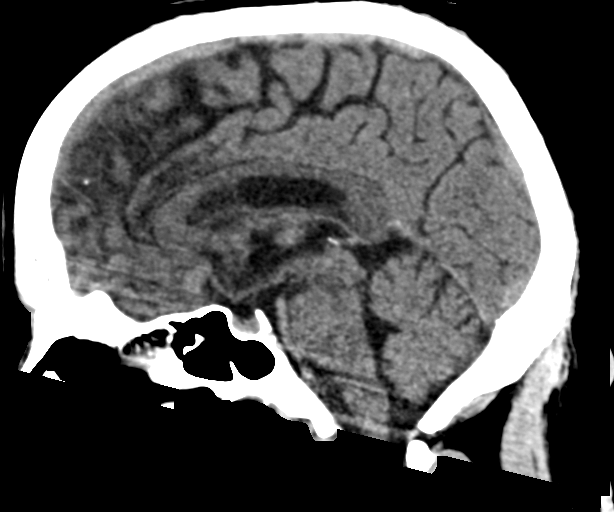
[im 37/55  brain]
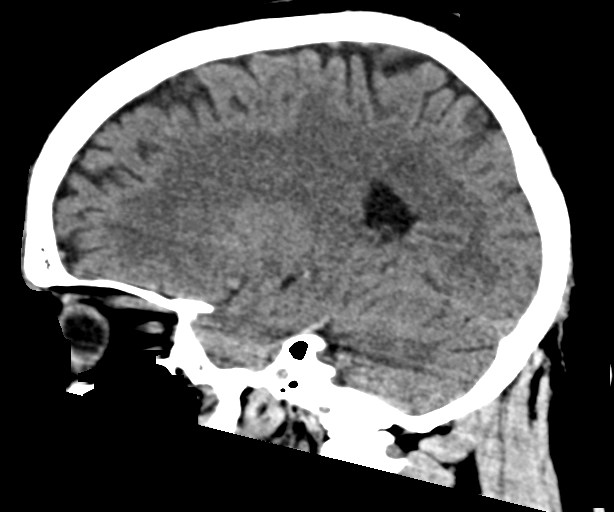

[16 of 47 positions shown; findings below may reference images not displayed]

FINDINGS: Brain: No acute cortical infarction, hemorrhage, mass, mass effect,
or midline shift. T2 hyperintense signal in the periventricular
white matter, likely the sequela of chronic small vessel ischemic
disease. No hydrocephalus or extra-axial collection.

Vascular: No hyperdense vessel.

Skull: Normal. Negative for fracture or focal lesion.

Sinuses/Orbits: No acute finding.

Other: The mastoids are well aerated.

ASPECTS (Alberta Stroke Program Early CT Score)

- Ganglionic level infarction (caudate, lentiform nuclei, internal
capsule, insula, M1-M3 cortex): 7

- Supraganglionic infarction (M4-M6 cortex): 3

Total score (0-10 with 10 being normal): 10
IMPRESSION: 1. No acute cortical infarction or acute hemorrhage.
2. ASPECTS is 10

Code stroke imaging results were communicated on [DATE] at [DATE] to provider SRPOUG via telephone, who verbally acknowledged
these results.

## 2021-07-25 IMAGING — MR MR HEAD W/O CM
10 of 11 series · 38 of 48 positions shown · IV contrast (gadavist)
Comparison: Prior head CT from earlier the same day.

CLINICAL DATA: Initial evaluation for acute TIA.

EXAM:
MRI HEAD WITHOUT CONTRAST
MRA HEAD WITHOUT CONTRAST
MRA NECK WITHOUT AND WITH CONTRAST
TECHNIQUE: Multiplanar, multi-echo pulse sequences of the brain and surrounding
structures were acquired without intravenous contrast. Angiographic
images of the Circle of Willis were acquired using MRA technique
without intravenous contrast. Angiographic images of the neck were
acquired using MRA technique without and with intravenous contrast.
Carotid stenosis measurements (when applicable) are obtained
utilizing NASCET criteria, using the distal internal carotid
diameter as the denominator.
CONTRAST:  9mL GADAVIST GADOBUTROL 1 MMOL/ML IV SOLN

[Series 13: ax dwi_tracew · axial · 3.0mm · 0.65mm/px · z∈[-135,+18]mm · 5 of 48 slices shown]
[im 1/48]
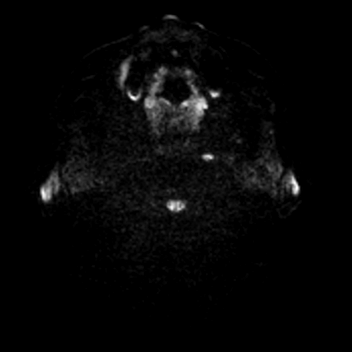
[im 12/48]
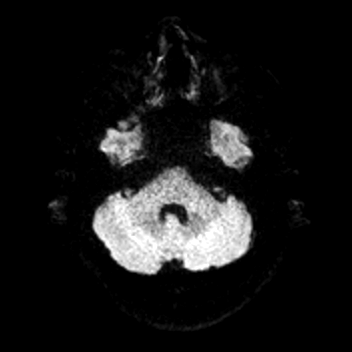
[im 24/48]
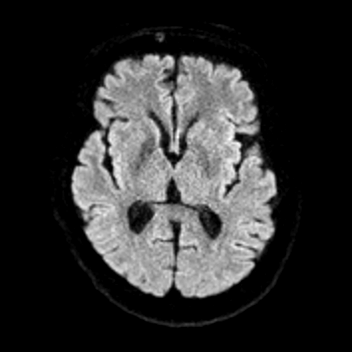
[im 36/48]
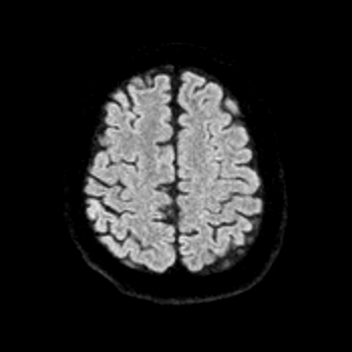
[im 48/48]
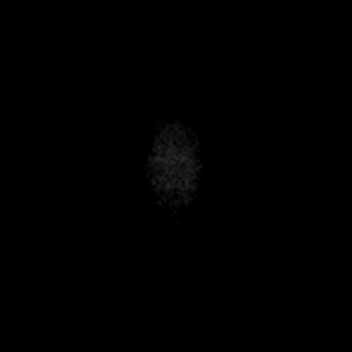

[Series 14: ax dwi_adc · axial · 3.0mm · 0.65mm/px · z∈[-135,+15]mm · 4 of 47 slices shown]
[im 1/47]
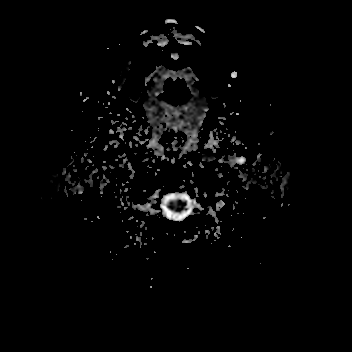
[im 16/47]
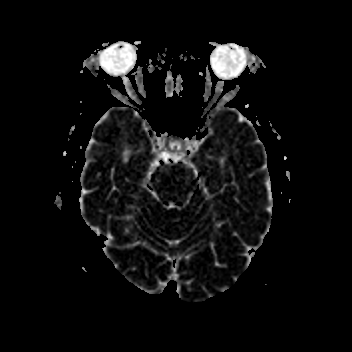
[im 31/47]
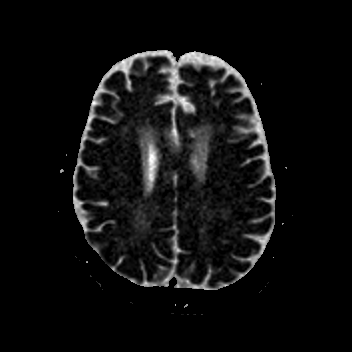
[im 47/47]
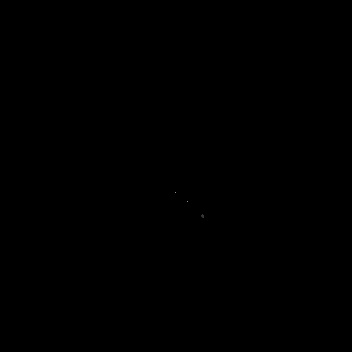

[Series 15: cor dwi_tracew · coronal · 5.0mm · 0.68mm/px · 3 of 40 slices shown]
[im 1/40]
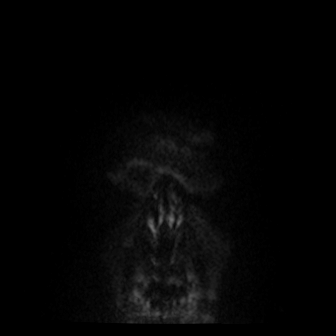
[im 20/40]
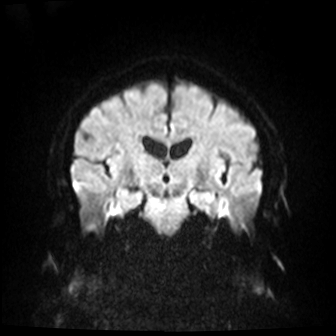
[im 40/40]
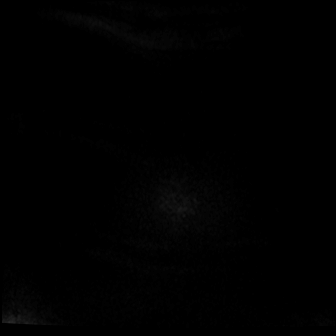

[Series 16: cor dwi_adc · coronal · 5.0mm · 0.68mm/px · 3 of 40 slices shown]
[im 1/40]
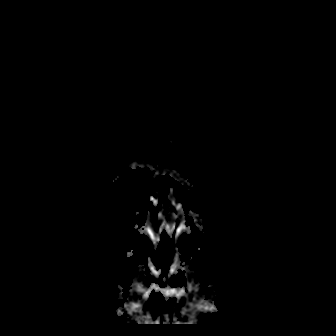
[im 20/40]
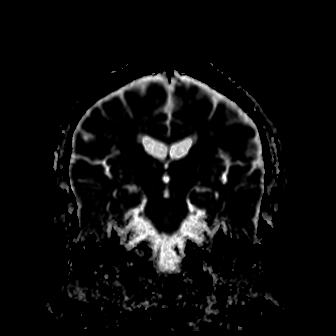
[im 40/40]
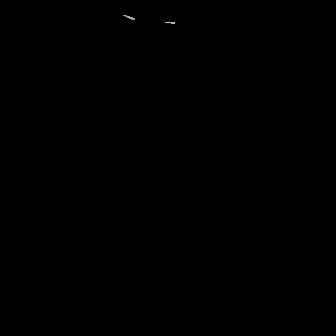

[Series 17: T1 · sagittal · 5.0mm · 0.62mm/px · 2 of 23 slices shown (1 of 2)]
[im 1/23]
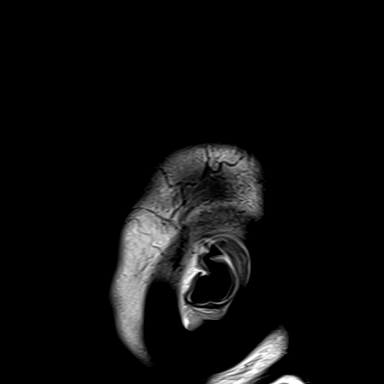
[im 23/23]
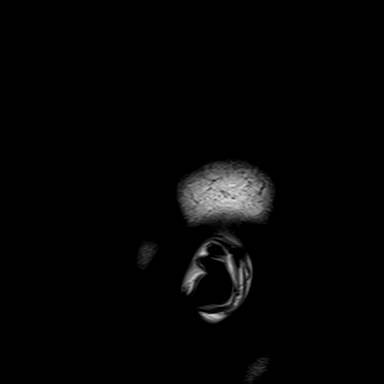

[Series 18: T2 · axial · 5.0mm · 0.53mm/px · z∈[-130,+24]mm · 2 of 27 slices shown (1 of 2)]
[im 1/27]
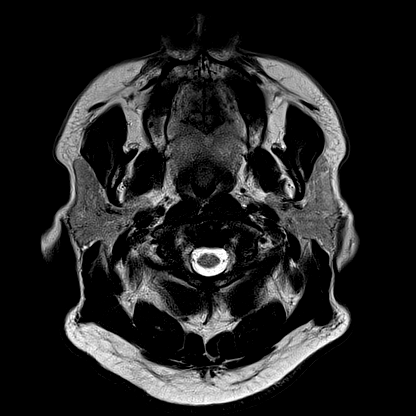
[im 27/27]
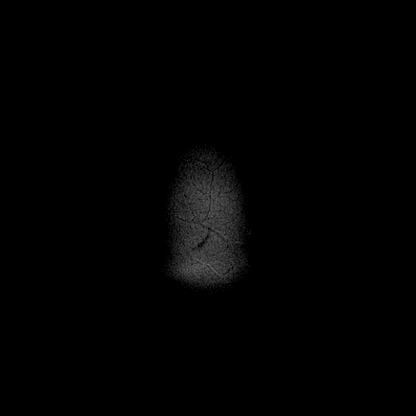

[Series 20: pha_images · axial · 3.0mm · 0.90mm/px · z∈[-139,+36]mm · 5 of 60 slices shown]
[im 1/60]
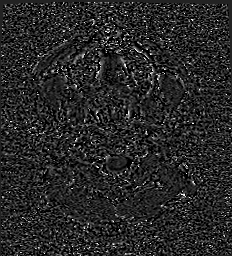
[im 15/60]
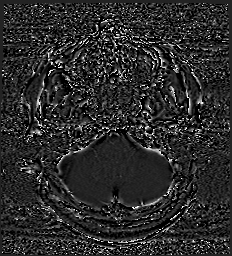
[im 30/60]
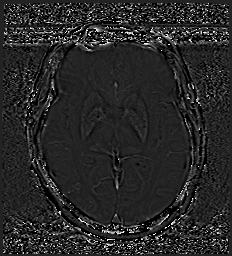
[im 45/60]
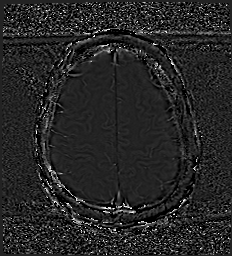
[im 60/60]
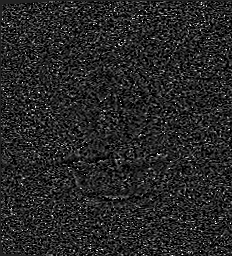

[Series 23: FLAIR · axial · 3.0mm · 0.53mm/px · z∈[-133,+27]mm · 4 of 55 slices shown]
[im 1/55]
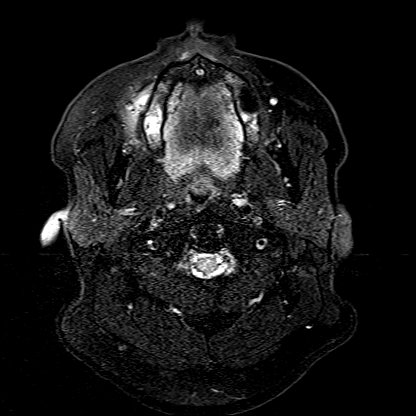
[im 19/55]
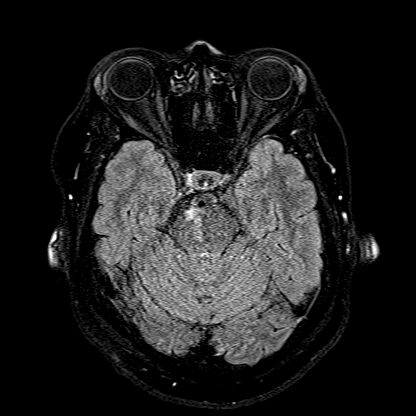
[im 37/55]
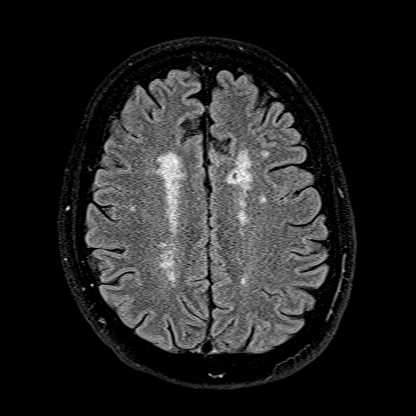
[im 55/55]
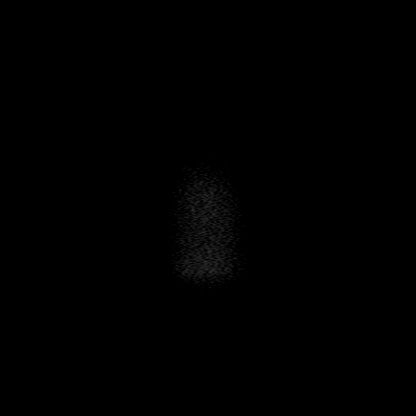

[Series 24: T1 · axial · 1.0mm · 0.98mm/px · z∈[-130,+27]mm · 8 of 160 slices shown (2 of 2)]
[im 1/160]
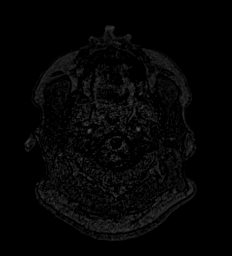
[im 27/160]
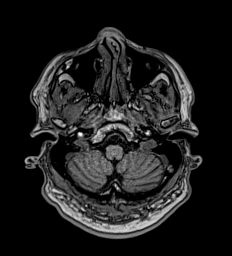
[im 54/160]
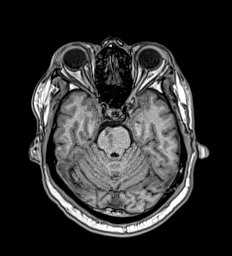
[im 67/160]
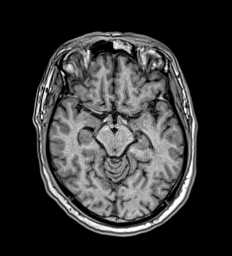
[im 93/160]
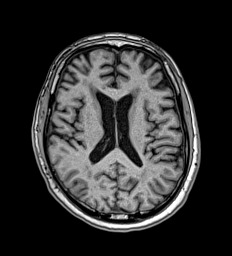
[im 107/160]
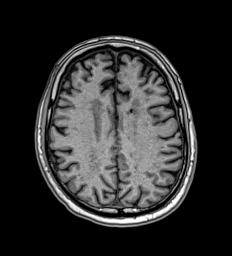
[im 133/160]
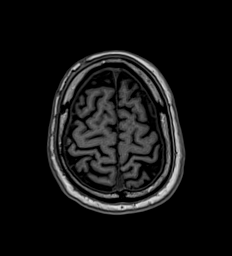
[im 160/160]
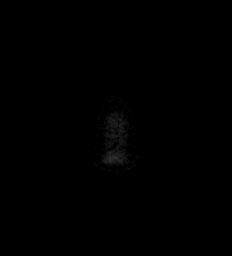

[Series 25: T2 · coronal · 5.0mm · 0.86mm/px · 2 of 30 slices shown (2 of 2)]
[im 1/30]
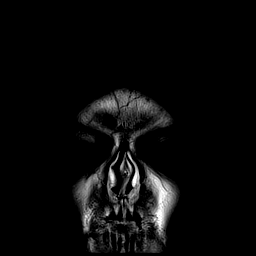
[im 30/30]
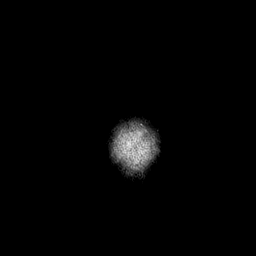

[38 of 48 positions shown; findings below may reference images not displayed]

FINDINGS: MRI HEAD FINDINGS

Brain: Volume cerebral volume within normal limits. Patchy T2/FLAIR
hyperintensity involving the periventricular and deep white matter
both cerebral hemispheres as well as the pons, most consistent with
chronic small vessel ischemic disease, mild in nature.

Subtle patchy diffusion signal abnormality seen involving the right
ventral pons (series 13, image 16). Associated T2/FLAIR signal
intensity without ADC correlate at this location. Associated T1
hypointensity (series 24, image 54). Finding felt to be most
consistent with an evolving late subacute infarct. Area of
involvement measures approximately 1.9 cm in length. No associated
hemorrhage or mass effect.

No other diffusion abnormality to suggest acute or subacute
ischemia. Gray-white matter differentiation otherwise maintained. No
other areas of chronic cortical infarction. No other acute or
chronic blood products.

No mass lesion, midline shift or mass effect no hydrocephalus or
extra-axial fluid collection. Pituitary gland suprasellar region
normal. Midline structures intact.

Vascular: Major intracranial vascular flow voids are maintained.

Skull and upper cervical spine: Craniocervical junction within
normal limits. Bone marrow signal intensity grossly within normal
limits. No focal marrow replacing lesion. No scalp soft tissue
abnormality.

Sinuses/Orbits: Globes orbital soft tissues within normal limits.
Paranasal sinuses are clear. No mastoid effusion. Inner ear
structures grossly normal.

Other: None.

MRA HEAD FINDINGS

Anterior circulation: Visualized distal cervical segments of the
internal carotid arteries are patent with antegrade flow. Petrous,
cavernous, and supraclinoid segments patent without stenosis. A1
segments patent bilaterally. Normal anterior communicating artery
complex. Both ACAs widely patent proximally. There are suspected
severe stenoses involving the distal A2/A3 segments bilaterally.
Distal ACAs are severely attenuated as resolved, although remain
grossly patent to their distal aspects on 3D time-of-flight
sequence.

Normal in stenosis or occlusion. Normal MCA bifurcations. Distal MCA
branches well perfused and symmetric. Diffuse small vessel
atheromatous irregularity noted about the MCA branches bilaterally.

Posterior circulation: Both V4 segments patent to the
vertebrobasilar junction without stenosis. Neither PICA origin well
visualized. Basilar patent to its distal aspect without stenosis.
Right SCA widely patent. Probable severe stenosis at the origin of
the left SCA. Left ICA grossly patent distally on time-of-flight
sequence. Both PCAs patent at their origins. Severe multifocal
stenoses seen involving the P2 segments bilaterally, left worse than
right (series [CO], image 6). PCAs remain grossly patent to their
distal aspects.

Anatomic variants: None significant.  No visible aneurysm.

MRA NECK FINDINGS

Aortic arch: Visualized aortic arch normal caliber with normal 3
vessel morphology. No stenosis about the origin the great vessels.

Right carotid system: Right common and internal carotid arteries
patent without stenosis or evidence for dissection.

Left carotid system: Left common internal carotid arteries patent
without stenosis or evidence for dissection.

Vertebral arteries: Both vertebral arteries arise from the
subclavian arteries. No significant proximal subclavian artery
stenosis. Vertebral arteries patent without stenosis or evidence for
dissection.

Other: None
IMPRESSION: MRI HEAD IMPRESSION:

1. Subtle 1.9 cm focus of patchy signal abnormality involving the
right ventral pons as above, most consistent with an infarct, late
subacute in appearance. No associated hemorrhage or mass effect.
2. No other acute intracranial abnormality.
3. Underlying mild chronic microvascular ischemic disease.

MRA HEAD IMPRESSION:

1. Negative MRA for large vessel occlusion.
2. Intracranial atherosclerotic disease with associated severe
multifocal stenoses involving the bilateral distal A2/A3 segments,
with additional severe multifocal stenoses involving the bilateral
P2 segments, left worse than right. Distal ACAs and PCAs are
severely attenuated, but remain grossly patent.
3. Probable severe stenosis at the origin of the left SCA.
4. Diffuse small vessel atheromatous irregularity about the MCA
branches bilaterally.

MRA NECK IMPRESSION:

Negative MRA of the neck. No hemodynamically significant or
correctable stenosis.

## 2021-07-25 MED ORDER — ATORVASTATIN CALCIUM 20 MG PO TABS
40.0000 mg | ORAL_TABLET | Freq: Every day | ORAL | Status: DC
  Administered 2021-07-25: 40 mg via ORAL
  Filled 2021-07-25: qty 2

## 2021-07-25 MED ORDER — SODIUM CHLORIDE 0.9% FLUSH
3.0000 mL | Freq: Once | INTRAVENOUS | Status: AC
  Administered 2021-07-25: 3 mL via INTRAVENOUS

## 2021-07-25 MED ORDER — ACETAMINOPHEN 160 MG/5ML PO SOLN
650.0000 mg | ORAL | Status: DC | PRN
  Filled 2021-07-25: qty 20.3

## 2021-07-25 MED ORDER — ACETAMINOPHEN 325 MG RE SUPP: 650.0000 mg | RECTAL | Status: DC | PRN

## 2021-07-25 MED ORDER — ACETAMINOPHEN 325 MG PO TABS: 650.0000 mg | ORAL_TABLET | ORAL | Status: DC | PRN

## 2021-07-25 MED ORDER — GADOBUTROL 1 MMOL/ML IV SOLN
9.0000 mL | Freq: Once | INTRAVENOUS | Status: AC | PRN
  Administered 2021-07-25: 9 mL via INTRAVENOUS

## 2021-07-25 MED ORDER — ZOLPIDEM TARTRATE 5 MG PO TABS
10.0000 mg | ORAL_TABLET | Freq: Once | ORAL | Status: AC
  Administered 2021-07-25: 10 mg via ORAL
  Filled 2021-07-25: qty 2

## 2021-07-25 MED ORDER — ASPIRIN EC 81 MG PO TBEC
81.0000 mg | DELAYED_RELEASE_TABLET | Freq: Every day | ORAL | Status: DC
  Administered 2021-07-25 – 2021-07-26 (×2): 81 mg via ORAL
  Filled 2021-07-25 (×2): qty 1

## 2021-07-25 MED ORDER — SODIUM CHLORIDE 0.9 % IV SOLN: INTRAVENOUS | Status: DC

## 2021-07-25 MED ORDER — STROKE: EARLY STAGES OF RECOVERY BOOK: Freq: Once | Status: DC

## 2021-07-25 MED ORDER — INSULIN ASPART 100 UNIT/ML IJ SOLN
0.0000 [IU] | Freq: Three times a day (TID) | INTRAMUSCULAR | Status: DC
  Administered 2021-07-26: 8 [IU] via SUBCUTANEOUS
  Administered 2021-07-26: 3 [IU] via SUBCUTANEOUS
  Administered 2021-07-26: 11 [IU] via SUBCUTANEOUS
  Filled 2021-07-25 (×3): qty 1

## 2021-07-25 MED ORDER — HEPARIN SODIUM (PORCINE) 5000 UNIT/ML IJ SOLN
5000.0000 [IU] | Freq: Three times a day (TID) | INTRAMUSCULAR | Status: DC
  Administered 2021-07-25: 5000 [IU] via SUBCUTANEOUS
  Filled 2021-07-25: qty 1

## 2021-07-25 NOTE — ED Notes (Signed)
Irena Cords MD at bedside

## 2021-07-25 NOTE — ED Provider Notes (Signed)
[facility] Provider Note    Event Date/Time   First MD Initiated Contact with Patient 07/25/21 1730     (approximate)   History   Code Stroke   HPI  Byrl Latin. is a 66 y.o. male with history of diabetes and hypertension who presents with slurred speech, acute onset this afternoon about 15-20 minutes prior to coming to the ED, and resolved right around the time he arrived in the ED.  The patient denies any associated headache, vision changes, weakness or numbness, or other acute symptoms.  He denies any prior history of episodes like this.   Physical Exam   Triage Vital Signs: ED Triage Vitals [07/25/21 1716]  Enc Vitals Group     BP (!) 157/98     Pulse Rate 79     Resp 15     Temp 97.8 F (36.6 C)     Temp Source Oral     SpO2 97 %     Weight      Height      Head Circumference      Peak Flow      Pain Score      Pain Loc      Pain Edu?      Excl. in GC?     Most recent vital signs: Vitals:   07/25/21 1716 07/25/21 1839  BP: (!) 157/98 (!) 155/86  Pulse: 79 70  Resp: 15 12  Temp: 97.8 F (36.6 C)   SpO2: 97% 98%     General: Awake, no distress.  CV:  Good peripheral perfusion.  Resp:  Normal effort.  Abd:  No distention.  Other:  Normal speech and language.  No facial droop.  5/5 motor strength and intact sensation to all extremities.  No ataxia on finger-to-nose.  No pronator drift.   ED Results / Procedures / Treatments   Labs (all labs ordered are listed, but only abnormal results are displayed) Labs Reviewed  COMPREHENSIVE METABOLIC PANEL - Abnormal; Notable for the following components:      Result Value   Glucose, Bld 306 (*)    All other components within normal limits  CBG MONITORING, ED - Abnormal; Notable for the following components:   Glucose-Capillary 322 (*)    All other components within normal limits  RESP PANEL BY RT-PCR (FLU A&B, COVID) ARPGX2  PROTIME-INR  APTT  CBC  DIFFERENTIAL   PROTIME-INR  URINALYSIS, ROUTINE W REFLEX MICROSCOPIC  URINE DRUG SCREEN, QUALITATIVE ([facility] ONLY)  AMMONIA  LIPID PANEL  T4, FREE  TSH  VITAMIN B12  HIV ANTIBODY (ROUTINE TESTING W REFLEX)  RAPID URINE DRUG SCREEN, HOSP PERFORMED  HEMOGLOBIN A1C  CREATININE, SERUM     EKG  ED ECG REPORT I, Dionne Bucy, the attending physician, personally viewed and interpreted this ECG.  Date: 07/25/2021 EKG Time: 1726 Rate: 73 Rhythm: normal sinus rhythm QRS Axis: normal Intervals: normal ST/T Wave abnormalities: Nonspecific T wave abnormality Narrative Interpretation: no evidence of acute ischemia    RADIOLOGY  CT head: I reviewed the images; there is no evidence of ICH.  Per radiology report, no evidence of acute stroke or other acute abnormality.  PROCEDURES:  Critical Care performed: Yes, see critical care procedure note(s)  .Critical Care Performed by: Dionne Bucy, MD Authorized by: Dionne Bucy, MD   Critical care provider statement:    Critical care time (minutes):  15   Critical care was necessary to treat or prevent imminent or life-threatening  deterioration of the following conditions:  CNS failure or compromise   Critical care was time spent personally by me on the following activities:  Development of treatment plan with patient or surrogate, discussions with consultants, examination of patient, ordering and review of laboratory studies, ordering and review of radiographic studies, re-evaluation of patient's condition and review of old charts   Care discussed with: admitting provider     MEDICATIONS ORDERED IN ED: Medications  sodium chloride flush (NS) 0.9 % injection 3 mL (has no administration in time range)  aspirin EC tablet 81 mg (has no administration in time range)  atorvastatin (LIPITOR) tablet 40 mg (has no administration in time range)   stroke: mapping our early stages of recovery book (has no administration in time range)  0.9 %   sodium chloride infusion (has no administration in time range)  acetaminophen (TYLENOL) tablet 650 mg (has no administration in time range)    Or  acetaminophen (TYLENOL) 160 MG/5ML solution 650 mg (has no administration in time range)    Or  acetaminophen (TYLENOL) suppository 650 mg (has no administration in time range)  insulin aspart (novoLOG) injection 0-15 Units (has no administration in time range)  heparin injection 5,000 Units (has no administration in time range)     IMPRESSION / MDM / ASSESSMENT AND PLAN / ED COURSE  I reviewed the triage vital signs and the nursing notes.  66 year old male with PMH as noted above presents with acute onset of slurred speech which is now resolved.  He denies other acute symptoms and has no prior history of this.  On exam, his vital signs are normal except for hypertension.  Neurologic exam is currently normal.  Differential diagnosis includes, but is not limited to, CVA, TIA, intracranial hemorrhage, partial seizure, metabolic etiology.  Code stroke was activated from triage.  I reviewed the CT, which shows no ICH.  I discussed the case with the radiologist who advises that there is no evidence of acute stroke or other abnormality.  I reviewed the CBC, BMP, and coagulation factors, all of which are within normal limits.  I consulted Dr. Selina Cooley from neurology.  Based on my discussion with her and her recommendations, given the resolved symptoms, code stroke was canceled.  She recommends admission for stroke work-up.  The patient is on the cardiac monitor to evaluate for evidence of arrhythmia and/or significant heart rate changes.  ----------------------------------------- 7:18 PM on 07/25/2021 -----------------------------------------  I consulted Dr. Allena Katz from the hospitalist service; based on her discussion she agrees to admit the patient.   FINAL CLINICAL IMPRESSION(S) / ED DIAGNOSES   Final diagnoses:  Speech disturbance,  unspecified type     Rx / DC Orders   ED Discharge Orders     None        Note:  This document was prepared using Dragon voice recognition software and may include unintentional dictation errors.    Dionne Bucy, MD 07/25/21 1919

## 2021-07-25 NOTE — ED Notes (Signed)
MRI screening patient at this time. 

## 2021-07-25 NOTE — ED Notes (Signed)
Patient c/o sudden tongue twisting and slurred speech that started at 5pm and last about 5 minutes. Once to ER symptoms subsided.

## 2021-07-25 NOTE — H&P (Signed)
History and Physical    James Mcmillan. [id number] DOB: 10/08/1955 DOA: 07/25/2021  PCP: Pcp, No    Patient coming from:  Home   Chief Complaint:  Slurred speech   HPI:  James Mcmillan. is a 66 y.o. male seen in ed with complaints of Slurred speech that started when they were in [place] talking about tires and wife noticed it as well as pt and came to ed immediately and symptoms resolved. No other associated symptoms and ros is negative.    Pt has past medical history of dm ii, htn, gout, alcohol abuse, h/o tobacco abuse.  ED Course:   Vitals:   07/25/21 1716 07/25/21 1839  BP: (!) 157/98 (!) 155/86  Pulse: 79 70  Resp: 15 12  Temp: 97.8 F (36.6 C)   TempSrc: Oral   SpO2: 97% 98%   In ed pt is A/O X 3. Vital's stable.afebrile. Labs show normal CMP/ CBC and resp panel neg for flu and covid.  Initial head CT negative for any acute hemorrhage or infarct.  Neurology was consulted.   Review of Systems:  Review of Systems  Neurological:  Positive for speech change.  All other systems reviewed and are negative.  Past Medical History:  Diagnosis Date   Alcohol abuse    Diabetes mellitus without complication ([place])    Gout    History of tobacco abuse    HTN (hypertension), benign    Hyperlipemia    Past Surgical History:  Procedure Laterality Date   BACK SURGERY     CHOLECYSTECTOMY     HERNIA REPAIR       reports that he has quit smoking. He has never used smokeless tobacco. He reports current alcohol use. He reports that he does not use drugs.  No Known Allergies  Family History  Problem Relation Age of Onset   COPD Father     Prior to Admission medications   Medication Sig Start Date End Date Taking? Authorizing Provider  allopurinol (ZYLOPRIM) 100 MG tablet Take 1 tablet (100 mg total) by mouth daily. 09/30/19 09/29/20  Caryn Section Linden Dolin, PA-C  colchicine 0.6 MG tablet Take 1 tablet (0.6 mg total) by mouth daily. 09/30/19 09/29/20  Fisher, Linden Dolin, PA-C   predniSONE (STERAPRED UNI-PAK 21 TAB) 10 MG (21) TBPK tablet Take 6 pills on day one then decrease by 1 pill each day 09/30/19   Versie Starks, PA-C    Physical Exam: Vitals:   07/25/21 1716 07/25/21 1839  BP: (!) 157/98 (!) 155/86  Pulse: 79 70  Resp: 15 12  Temp: 97.8 F (36.6 C)   TempSrc: Oral   SpO2: 97% 98%   Physical Exam Vitals and nursing note reviewed.  Constitutional:      General: He is not in acute distress.    Appearance: He is not ill-appearing.  HENT:     Head: Normocephalic and atraumatic.     Right Ear: External ear normal.     Left Ear: External ear normal.     Nose: Nose normal.     Mouth/Throat:     Mouth: Mucous membranes are moist.  Eyes:     Extraocular Movements: Extraocular movements intact.     Pupils: Pupils are equal, round, and reactive to light.  Neck:     Vascular: No carotid bruit.  Cardiovascular:     Rate and Rhythm: Normal rate and regular rhythm.     Pulses: Normal pulses.     Heart sounds:  Normal heart sounds.  Pulmonary:     Effort: Pulmonary effort is normal.     Breath sounds: Normal breath sounds.  Abdominal:     General: Bowel sounds are normal. There is no distension.     Palpations: Abdomen is soft. There is no mass.     Tenderness: There is no abdominal tenderness. There is no guarding.     Hernia: No hernia is present.  Musculoskeletal:     Right lower leg: No edema.     Left lower leg: No edema.  Skin:    General: Skin is warm.  Neurological:     General: No focal deficit present.     Mental Status: He is alert and oriented to person, place, and time.  Psychiatric:        Mood and Affect: Mood normal.        Behavior: Behavior normal.    Labs on Admission: I have personally reviewed following labs and imaging studies No results for input(s): CKTOTAL, CKMB, TROPONINI in the last 72 hours. Lab Results  Component Value Date   WBC 5.7 07/25/2021   HGB 14.5 07/25/2021   HCT 42.9 07/25/2021   MCV 87.6  07/25/2021   PLT 221 07/25/2021    Recent Labs  Lab 07/25/21 1732  NA 135  K 4.1  CL 102  CO2 24  BUN 17  CREATININE 1.12  CALCIUM 9.4  PROT 7.2  BILITOT 0.9  ALKPHOS 58  ALT 18  AST 24  GLUCOSE 306*   No results found for: CHOL, HDL, LDLCALC, TRIG No results found for: DDIMER Invalid input(s): POCBNP   COVID-19 Labs No results for input(s): DDIMER, FERRITIN, LDH, CRP in the last 72 hours. Lab Results  Component Value Date   SARSCOV2NAA NEGATIVE 07/25/2021    Radiological Exams on Admission: CT HEAD CODE STROKE WO CONTRAST  Result Date: 07/25/2021 CLINICAL DATA:  Code stroke. Slurred speech, slight left facial droop EXAM: CT HEAD WITHOUT CONTRAST TECHNIQUE: Contiguous axial images were obtained from the base of the skull through the vertex without intravenous contrast. RADIATION DOSE REDUCTION: This exam was performed according to the departmental dose-optimization program which includes automated exposure control, adjustment of the mA and/or kV according to patient size and/or use of iterative reconstruction technique. COMPARISON:  None. FINDINGS: Brain: No acute cortical infarction, hemorrhage, mass, mass effect, or midline shift. T2 hyperintense signal in the periventricular white matter, likely the sequela of chronic small vessel ischemic disease. No hydrocephalus or extra-axial collection. Vascular: No hyperdense vessel. Skull: Normal. Negative for fracture or focal lesion. Sinuses/Orbits: No acute finding. Other: The mastoids are well aerated. ASPECTS [facility]([place] Stroke Program Early CT Score) - Ganglionic level infarction (caudate, lentiform nuclei, internal capsule, insula, M1-M3 cortex): 7 - Supraganglionic infarction (M4-M6 cortex): 3 Total score (0-10 with 10 being normal): 10 IMPRESSION: 1. No acute cortical infarction or acute hemorrhage. 2. ASPECTS is 10 Code stroke imaging results were communicated on 07/25/2021 at 5:28 pm to [facility] telephone, who verbally  acknowledged these results. Electronically Signed   By: Wiliam KeAlison  Vasan M.D.   On: 07/25/2021 17:29    EKG: Independently reviewed.  SR 73 with prominent p wave in lead II - LAE. O/W no st changes.     Assessment/Plan: Principal Problem:   Slurred speech Active Problems:   HTN (hypertension), benign   Gout   Diabetes mellitus without complication (HCC)   Hyperlipemia   Alcohol abuse   History of tobacco abuse   Slurred  speech: Will admit to med tele for TIA/CVA eval.  Proceed with MRI head and mra of head and neck.  2 d echo.  Ammonia level. TFT's.  Will consider c-spine eval. Accuchecks to identify any hypoglycemic eval contributing to his slurred speech.    HTN: Blood pressure (!) 155/86, pulse 70, temperature 97.8 F (36.6 C), temperature source Oral, resp. rate 12, SpO2 98 %. Hold home meds to allow for permissive htn.   Gout: Cont allopurinol once pt passes bedside swallow.   DM II: Hold antihyperglycemic. Cont with Glycemic protocol with accucheck/ a1c .    DVT prophylaxis:  Heparin    Code Status:  Full code     Family Communication:  Harrie, Henegar (Spouse)  [phone] (Mobile)   Disposition Plan:  Home    Consults called:  Neurology- Dr. Quinn Axe.  Admission status: Observation.     Medical Decision Making   Coding    Para Skeans MD [facility]  6 PM- 2 AM. Please contact me via secure Chat 6 PM-2 AM. To contact the [facility] Attending or Consulting provider [place] or covering provider during after hours [place], for this patient.   Check the care team in [facility] and look for a) attending/consulting [facility] provider listed and b) the [facility] team listed Log into [email or web address] and use [place]'s universal password to access. If you do not have the password, please contact the hospital operator. Locate the [facility] provider you are looking for under [facility] and page to a number that you can be directly reached. If you still have difficulty  reaching the provider, please page the [facility] (Director on Call) for the Hospitalists listed on amion for assistance. [email or web address] 07/25/2021, 7:26 PM

## 2021-07-25 NOTE — ED Notes (Signed)
Code stroke cancelled 

## 2021-07-25 NOTE — Progress Notes (Signed)
°   07/25/21 2300  Clinical Encounter Type  Visited With Patient and family together  Visit Type Initial  Referral From Nurse  Consult/Referral To Chaplain    Chaplain responded to Code Stroke. Chaplain provided compassionate non-anxious presence to pt. and family. They expressed emotional concerns. Pt. and family appreciated chaplain visit.

## 2021-07-25 NOTE — ED Triage Notes (Signed)
First Nurse Note:  Arrives with c/o slurred speech, onset 15 minutes ago.  Presents with symptoms resolved.  Slight left facial droop noted.  MAE equally and strong.  Ambulates with easy and steady gait. NAD

## 2021-07-26 ENCOUNTER — Observation Stay (HOSPITAL_BASED_OUTPATIENT_CLINIC_OR_DEPARTMENT_OTHER)
Admit: 2021-07-26 | Discharge: 2021-07-26 | Disposition: A | Discharge status: Home / Self Care | Payer: BC Managed Care – PPO | Attending: Internal Medicine | Admitting: Internal Medicine

## 2021-07-26 ENCOUNTER — Observation Stay: Admit: 2021-07-26 | Payer: BC Managed Care – PPO

## 2021-07-26 DIAGNOSIS — I6389 Other cerebral infarction: Secondary | ICD-10-CM | POA: Diagnosis not present

## 2021-07-26 DIAGNOSIS — I639 Cerebral infarction, unspecified: Secondary | ICD-10-CM | POA: Diagnosis not present

## 2021-07-26 DIAGNOSIS — R4781 Slurred speech: Secondary | ICD-10-CM | POA: Diagnosis not present

## 2021-07-26 DIAGNOSIS — R479 Unspecified speech disturbances: Secondary | ICD-10-CM | POA: Diagnosis not present

## 2021-07-26 DIAGNOSIS — Z87891 Personal history of nicotine dependence: Secondary | ICD-10-CM

## 2021-07-26 LAB — ECHOCARDIOGRAM COMPLETE
AR max vel: 2.3 cm2
AV Peak grad: 4.5 mmHg
Ao pk vel: 1.06 m/s
Area-P 1/2: 4.29 cm2
S' Lateral: 3.2 cm

## 2021-07-26 LAB — CBG MONITORING, ED
Glucose-Capillary: 306 mg/dL — ABNORMAL HIGH (ref 70–99)
Glucose-Capillary: 267 mg/dL — ABNORMAL HIGH (ref 70–99)
Glucose-Capillary: 169 mg/dL — ABNORMAL HIGH (ref 70–99)

## 2021-07-26 LAB — HIV ANTIBODY (ROUTINE TESTING W REFLEX): HIV Screen 4th Generation wRfx: NONREACTIVE

## 2021-07-26 LAB — LDL CHOLESTEROL, DIRECT: Direct LDL: 162.8 mg/dL — ABNORMAL HIGH (ref 0–99)

## 2021-07-26 LAB — VITAMIN B12: Vitamin B-12: 471 pg/mL (ref 180–914)

## 2021-07-26 MED ORDER — ATORVASTATIN CALCIUM 20 MG PO TABS: 80.0000 mg | ORAL_TABLET | Freq: Every day | ORAL | Status: DC

## 2021-07-26 MED ORDER — CLOPIDOGREL BISULFATE 75 MG PO TABS: 75.0000 mg | ORAL_TABLET | Freq: Every day | ORAL | 2 refills | Status: AC

## 2021-07-26 MED ORDER — CLOPIDOGREL BISULFATE 75 MG PO TABS
75.0000 mg | ORAL_TABLET | Freq: Every day | ORAL | Status: DC
  Administered 2021-07-26: 75 mg via ORAL
  Filled 2021-07-26: qty 1

## 2021-07-26 MED ORDER — ASPIRIN 81 MG PO TBEC: 81.0000 mg | DELAYED_RELEASE_TABLET | Freq: Every day | ORAL | 11 refills | Status: AC

## 2021-07-26 MED ORDER — ATORVASTATIN CALCIUM 80 MG PO TABS: 80.0000 mg | ORAL_TABLET | Freq: Every day | ORAL | 0 refills | Status: AC

## 2021-07-26 NOTE — Consult Note (Incomplete)
This is a 66 year old gentleman with a history of diabetes type 2, hypertension, alcohol abuse, history of tobacco abuse who presented to the emergency department yesterday evening after a transient episode of slurred speech while he was watching his car.  His symptoms had resolved prior to ED arrival and he had no neurologic deficits on their exam.  He currently reports no subjective neurologic deficits.  MRI brain showed a probable subacute 1.9 cm focus of patchy signal abnormality in the right ventral pons most consistent with late subacute infarct.  MRA head showed no LVO but did show severe multifocal intracranial stenoses.  MRA of the neck did not show any hemodynamically significant stenoses. Hgb A1c is in process. LDL unable to be calculated 2/2 triglyceride >400.

## 2021-07-26 NOTE — Progress Notes (Signed)
SLP Cancellation Note  Patient Details Name: James Mcmillan. MRN: [id number] DOB: 1955-08-27   Cancelled treatment:       Reason Eval/Treat Not Completed: SLP screened, no needs identified, will sign off  Chart review, nursing consulted, all acute deficits have resolved at this time. Should pt's condition change, please reconsult ST services.   James Lindbloom B. Dreama Saa M.S., CCC-SLP, [facility] Speech-Language Pathologist Rehabilitation Services Office [phone]  [name] Dreama Saa 07/26/2021, 10:00 AM

## 2021-07-26 NOTE — Progress Notes (Signed)
*  PRELIMINARY RESULTS* Echocardiogram 2D Echocardiogram has been performed.  Jonette Mate Chandani Rogowski 07/26/2021, 1:24 PM

## 2021-07-26 NOTE — ED Notes (Signed)
Pt resting comfortably at this time. Pt requests to use restroom. Pt denies dizziness or weakness. Pt ambulatory with steady gait. NAD noted. Pt educated on use of call bell and restroom.

## 2021-07-26 NOTE — ED Notes (Signed)
D/C, new RX's, risks of taking new blood thinner, and reasons to return to ED discussed with pt, pt verbalized understanding. Pt ambulatory with steady gait on D/C. Wife with pt.

## 2021-07-26 NOTE — ED Notes (Signed)
Echo at bedside

## 2021-07-26 NOTE — Progress Notes (Signed)
PT Screen Note  Patient Details Name: James Mcmillan. MRN: [id number] DOB: Dec 05, 1955   Cancelled Treatment:    Reason Eval/Treat Not Completed: PT screened, no needs identified, will sign off Pt with no residual symptoms, strength is = and functional b/l.  Able to ambulate 200 ft w/o AD, good speed and self reporting feeling no different from his baseline.  Pt safe to return home w/o further PT needs, will sign off.   Kreg Shropshire, DPT 07/26/2021, 12:13 PM

## 2021-07-26 NOTE — Progress Notes (Signed)
OT Cancellation Note  Patient Details Name: James Mcmillan. MRN: [id number] DOB: [birth year]   Cancelled Treatment:    Reason Eval/Treat Not Completed: OT screened. Order received, chart reviewed. Based on chart review, consults with RN and PT, pt is back to baseline level of fxl mobility, no needs identified. Will sign off.  Josiah Lobo, PhD, MS, OTR/L 07/26/21, 12:26 PM

## 2021-07-26 NOTE — ED Notes (Signed)
Pt sleeping soundly.  Easily aroused by verbal stimuli.  Pt denies any needs at this time.

## 2021-07-28 LAB — HEMOGLOBIN A1C
Hgb A1c MFr Bld: 12.5 % — ABNORMAL HIGH (ref 4.8–5.6)
Mean Plasma Glucose: 312 mg/dL
